# Patient Record
Sex: Female | Born: 1995 | Race: White | Hispanic: No | Marital: Single | Smoking: Former smoker
Health system: Southern US, Community
[De-identification: ages and names within clinical notes are randomized; demographics above are authoritative.]

## PROBLEM LIST (undated history)

## (undated) DIAGNOSIS — D682 Hereditary deficiency of other clotting factors: Secondary | ICD-10-CM

## (undated) DIAGNOSIS — I82409 Acute embolism and thrombosis of unspecified deep veins of unspecified lower extremity: Secondary | ICD-10-CM

## (undated) DIAGNOSIS — E119 Type 2 diabetes mellitus without complications: Secondary | ICD-10-CM

## (undated) HISTORY — PX: APPENDECTOMY: SHX54

---

## 2020-02-07 DIAGNOSIS — E1010 Type 1 diabetes mellitus with ketoacidosis without coma: Secondary | ICD-10-CM | POA: Diagnosis not present

## 2020-02-07 DIAGNOSIS — R4182 Altered mental status, unspecified: Secondary | ICD-10-CM | POA: Diagnosis not present

## 2020-02-07 DIAGNOSIS — T401X1A Poisoning by heroin, accidental (unintentional), initial encounter: Secondary | ICD-10-CM | POA: Diagnosis not present

## 2020-02-07 DIAGNOSIS — E871 Hypo-osmolality and hyponatremia: Secondary | ICD-10-CM | POA: Diagnosis not present

## 2020-02-08 DIAGNOSIS — E871 Hypo-osmolality and hyponatremia: Secondary | ICD-10-CM | POA: Diagnosis not present

## 2020-02-08 DIAGNOSIS — E1010 Type 1 diabetes mellitus with ketoacidosis without coma: Secondary | ICD-10-CM | POA: Diagnosis not present

## 2020-02-08 DIAGNOSIS — T401X1A Poisoning by heroin, accidental (unintentional), initial encounter: Secondary | ICD-10-CM | POA: Diagnosis not present

## 2020-02-08 DIAGNOSIS — R4182 Altered mental status, unspecified: Secondary | ICD-10-CM | POA: Diagnosis not present

## 2020-08-10 ENCOUNTER — Emergency Department (HOSPITAL_COMMUNITY)
Admission: EM | Admit: 2020-08-10 | Discharge: 2020-08-10 | Disposition: A | Discharge status: Home / Self Care | Payer: Medicaid Other | Attending: Emergency Medicine | Admitting: Emergency Medicine

## 2020-08-10 ENCOUNTER — Encounter (HOSPITAL_COMMUNITY): Payer: Self-pay

## 2020-08-10 ENCOUNTER — Other Ambulatory Visit: Payer: Self-pay

## 2020-08-10 DIAGNOSIS — Z794 Long term (current) use of insulin: Secondary | ICD-10-CM | POA: Diagnosis not present

## 2020-08-10 DIAGNOSIS — F17210 Nicotine dependence, cigarettes, uncomplicated: Secondary | ICD-10-CM | POA: Diagnosis not present

## 2020-08-10 DIAGNOSIS — Z7984 Long term (current) use of oral hypoglycemic drugs: Secondary | ICD-10-CM | POA: Diagnosis not present

## 2020-08-10 DIAGNOSIS — R739 Hyperglycemia, unspecified: Secondary | ICD-10-CM | POA: Diagnosis present

## 2020-08-10 DIAGNOSIS — E1165 Type 2 diabetes mellitus with hyperglycemia: Secondary | ICD-10-CM | POA: Insufficient documentation

## 2020-08-10 HISTORY — DX: Type 2 diabetes mellitus without complications: E11.9

## 2020-08-10 LAB — CBC WITH DIFFERENTIAL/PLATELET
Abs Immature Granulocytes: 0.04 10*3/uL (ref 0.00–0.07)
Basophils Absolute: 0.1 10*3/uL (ref 0.0–0.1)
Basophils Relative: 1 %
Eosinophils Absolute: 0.2 10*3/uL (ref 0.0–0.5)
Eosinophils Relative: 2 %
HCT: 37.5 % (ref 36.0–46.0)
Hemoglobin: 12.5 g/dL (ref 12.0–15.0)
Immature Granulocytes: 0 %
Lymphocytes Relative: 20 %
Lymphs Abs: 1.9 10*3/uL (ref 0.7–4.0)
MCH: 32.8 pg (ref 26.0–34.0)
MCHC: 33.3 g/dL (ref 30.0–36.0)
MCV: 98.4 fL (ref 80.0–100.0)
Monocytes Absolute: 0.8 10*3/uL (ref 0.1–1.0)
Monocytes Relative: 9 %
Neutro Abs: 6.3 10*3/uL (ref 1.7–7.7)
Neutrophils Relative %: 68 %
Platelets: 226 10*3/uL (ref 150–400)
RBC: 3.81 MIL/uL — ABNORMAL LOW (ref 3.87–5.11)
RDW: 11.7 % (ref 11.5–15.5)
WBC: 9.2 10*3/uL (ref 4.0–10.5)
nRBC: 0 % (ref 0.0–0.2)

## 2020-08-10 LAB — BASIC METABOLIC PANEL
Anion gap: 14 (ref 5–15)
BUN: 7 mg/dL (ref 6–20)
CO2: 22 mmol/L (ref 22–32)
Calcium: 8.5 mg/dL — ABNORMAL LOW (ref 8.9–10.3)
Chloride: 95 mmol/L — ABNORMAL LOW (ref 98–111)
Creatinine, Ser: 0.79 mg/dL (ref 0.44–1.00)
GFR, Estimated: >60 mL/min (ref >60)
Glucose, Bld: 469 mg/dL — ABNORMAL HIGH (ref 70–99)
Potassium: 4.1 mmol/L (ref 3.5–5.1)
Sodium: 131 mmol/L — ABNORMAL LOW (ref 135–145)

## 2020-08-10 LAB — CBG MONITORING, ED
Glucose-Capillary: 470 mg/dL — ABNORMAL HIGH (ref 70–99)
Glucose-Capillary: 347 mg/dL — ABNORMAL HIGH (ref 70–99)

## 2020-08-10 LAB — I-STAT BETA HCG BLOOD, ED (MC, WL, AP ONLY): I-stat hCG, quantitative: <5 m[IU]/mL (ref <5)

## 2020-08-10 MED ORDER — INSULIN ASPART 100 UNIT/ML ~~LOC~~ SOLN
6.0000 [IU] | Freq: Once | SUBCUTANEOUS | Status: AC
  Administered 2020-08-10: 6 [IU] via INTRAVENOUS
  Filled 2020-08-10: qty 0.06

## 2020-08-10 MED ORDER — SODIUM CHLORIDE 0.9 % IV BOLUS
1000.0000 mL | Freq: Once | INTRAVENOUS | Status: AC
  Administered 2020-08-10: 1000 mL via INTRAVENOUS

## 2020-08-10 NOTE — ED Provider Notes (Signed)
Rebecca Bryant   CSN: [id number] Arrival date & time: 08/10/20  1342     History Chief Complaint  Patient presents with  . Hyperglycemia    Rebecca Bryant is a 24 y.o. female.  24 year old female with prior medical history detailed below presents for evaluation of reported hyperglycemia.  Patient reports poor compliance with previously prescribed diabetes medications.  Patient reports that EMS presented to her house after a minor fall yesterday.  She was advised that her sugars are high.  She is presenting today requesting treatment for those sugars.  She denies nausea or vomiting.  She denies fever.  She is unsure as to the last time she took anything for her sugar at home.  The history is provided by the patient and medical records.  Hyperglycemia Blood sugar level PTA:  500 Severity:  Moderate Onset quality:  Unable to specify Timing:  Unable to specify Progression:  Unable to specify Diabetes status:  Unable to specify      Past Medical History:  Diagnosis Date  . Diabetes mellitus without complication (HCC)     There are no problems to display for this patient.   History reviewed. No pertinent surgical history.   OB History   No obstetric history on file.     History reviewed. No pertinent family history.  Social History   Tobacco Use  . Smoking status: Current Every Day Smoker    Types: Cigarettes  . Smokeless tobacco: Current User  . 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 Alcohol use: Never  . Drug use: Not on file    Home Medications Prior to Admission medications   Medication Sig Start Date End Date Taking? Authorizing Provider  Continuous Blood Gluc Receiver (DEXCOM G6 RECEIVER) DEVI Use as directed for continuous glucose monitoring. 07/18/20  Yes [provider]  insulin aspart (NOVOLOG) 100 UNIT/ML injection Use as directed. Max daily dose is 50  units. 08/03/20  Yes [provider]  Continuous Blood Gluc Receiver (DEXCOM G6 RECEIVER) DEVI USE AS DIRECTED FOR CONTINOUS GLUCOSE MONITORING 08/03/20   [provider]  NOVOLOG 100 UNIT/ML injection Inject into the skin as directed. 08/03/20   [provider]    Allergies    Patient has no known allergies.  Review of Systems   Review of Systems  All other systems reviewed and are negative.   Physical Exam Updated Vital Signs BP (!) 126/93 (BP Location: Left Arm)   Pulse 94   Temp 98.7 F (37.1 C) (Oral)   Resp 16   Ht 5\' 9"  (1.753 m)   Wt 50.3 kg   SpO2 96%   BMI 16.39 kg/m   Physical Exam Vitals and nursing Bryant reviewed.  Constitutional:      General: She is not in acute distress.    Appearance: Normal appearance. She is well-developed.  HENT:     Head: Normocephalic and atraumatic.  Eyes:     Conjunctiva/sclera: Conjunctivae normal.     Pupils: Pupils are equal, round, and reactive to light.  Cardiovascular:     Rate and Rhythm: Normal rate and regular rhythm.     Heart sounds: Normal heart sounds.  Pulmonary:     Effort: Pulmonary effort is normal. No respiratory distress.     Breath sounds: Normal breath sounds.  Abdominal:     General: There is no distension.     Palpations: Abdomen is soft.  Tenderness: There is no abdominal tenderness.  Musculoskeletal:        General: No deformity. Normal range of motion.     Cervical back: Normal range of motion and neck supple.  Skin:    General: Skin is warm and dry.  Neurological:     General: No focal deficit present.     Mental Status: She is alert and oriented to person, place, and time.     ED Results / Procedures / Treatments   Labs (all labs ordered are listed, but only abnormal results are displayed) Labs Reviewed  CBC WITH DIFFERENTIAL/PLATELET - Abnormal; Notable for the following components:      Result Value   RBC 3.81 (*)    All other components within normal  limits  CBG MONITORING, ED - Abnormal; Notable for the following components:   Glucose-Capillary 470 (*)    All other components within normal limits  BASIC METABOLIC PANEL  I-STAT BETA HCG BLOOD, ED ([facility], [facility], [facility] ONLY)    EKG None  Radiology No results found.  Procedures Procedures (including critical care time)  Medications Ordered in ED Medications  sodium chloride 0.9 % bolus 1,000 mL (1,000 mLs Intravenous New Bag/Given 08/10/20 1428)  insulin aspart (novoLOG) injection 6 Units (6 Units Intravenous Given 08/10/20 1508)    ED Course  I have reviewed the triage vital signs and the nursing notes.  Pertinent labs & imaging results that were available during my care of the patient were reviewed by me and considered in my medical decision making (see chart for details).    MDM Rules/Calculators/A&P                         MDM  Screen complete  Rebecca Bryant was evaluated in Emergency Department on 08/10/2020 for the symptoms described in the history of present illness. She was evaluated in the context of the global COVID-19 pandemic, which necessitated consideration that the patient might be at risk for infection with the SARS-CoV-2 virus that causes COVID-19. Institutional protocols and algorithms that pertain to the evaluation of patients at risk for COVID-19 are in a state of rapid change based on information released by regulatory bodies including the CDC and federal and state organizations. These policies and algorithms were followed during the patient's care in the ED.   Patient is presenting for evaluation of reported hyperglycemia.  Patient without evidence of DKA or other metabolic abnormality on work-up.  Patient does appear to be appropriate for outpatient management.  Importance of close follow-up is stressed.   Final Clinical Impression(s) / ED Diagnoses Final diagnoses:  Hyperglycemia    Rx / DC Orders ED Discharge Orders    None       Wynetta Fines,  MD 08/10/20 1539

## 2020-08-10 NOTE — Discharge Instructions (Addendum)
Please return for any problem.   Take your medications as prescribed.

## 2020-08-10 NOTE — ED Triage Notes (Signed)
Pt BIB EMS. Pt c/o fluctuating glucose readings for the past week. Pt does take insulin; pt states she does not always take it like she should. Pt complaining of feeling dehydrated. EMS gave 400 fluids.   CBG-499 BP-130/80 HR-104

## 2020-09-11 ENCOUNTER — Other Ambulatory Visit: Payer: Self-pay

## 2020-09-11 DIAGNOSIS — F17210 Nicotine dependence, cigarettes, uncomplicated: Secondary | ICD-10-CM | POA: Diagnosis present

## 2020-09-11 DIAGNOSIS — E860 Dehydration: Secondary | ICD-10-CM | POA: Diagnosis present

## 2020-09-11 DIAGNOSIS — E1065 Type 1 diabetes mellitus with hyperglycemia: Secondary | ICD-10-CM | POA: Diagnosis present

## 2020-09-11 DIAGNOSIS — Z794 Long term (current) use of insulin: Secondary | ICD-10-CM

## 2020-09-11 DIAGNOSIS — T383X6A Underdosing of insulin and oral hypoglycemic [antidiabetic] drugs, initial encounter: Secondary | ICD-10-CM | POA: Diagnosis present

## 2020-09-11 DIAGNOSIS — E1043 Type 1 diabetes mellitus with diabetic autonomic (poly)neuropathy: Principal | ICD-10-CM | POA: Diagnosis present

## 2020-09-11 DIAGNOSIS — Z9114 Patient's other noncompliance with medication regimen: Secondary | ICD-10-CM

## 2020-09-11 DIAGNOSIS — K5900 Constipation, unspecified: Secondary | ICD-10-CM | POA: Diagnosis present

## 2020-09-11 DIAGNOSIS — Z91128 Patient's intentional underdosing of medication regimen for other reason: Secondary | ICD-10-CM

## 2020-09-11 DIAGNOSIS — B1920 Unspecified viral hepatitis C without hepatic coma: Secondary | ICD-10-CM | POA: Diagnosis present

## 2020-09-11 DIAGNOSIS — K3184 Gastroparesis: Secondary | ICD-10-CM | POA: Diagnosis present

## 2020-09-11 DIAGNOSIS — Z20822 Contact with and (suspected) exposure to covid-19: Secondary | ICD-10-CM | POA: Diagnosis present

## 2020-09-11 LAB — COMPREHENSIVE METABOLIC PANEL
ALT: 97 U/L — ABNORMAL HIGH (ref 0–44)
AST: 71 U/L — ABNORMAL HIGH (ref 15–41)
Albumin: 3.8 g/dL (ref 3.5–5.0)
Alkaline Phosphatase: 59 U/L (ref 38–126)
Anion gap: 13 (ref 5–15)
BUN: 10 mg/dL (ref 6–20)
CO2: 25 mmol/L (ref 22–32)
Calcium: 9.2 mg/dL (ref 8.9–10.3)
Chloride: 100 mmol/L (ref 98–111)
Creatinine, Ser: 0.82 mg/dL (ref 0.44–1.00)
GFR, Estimated: >60 mL/min (ref >60)
Glucose, Bld: 175 mg/dL — ABNORMAL HIGH (ref 70–99)
Potassium: 3.7 mmol/L (ref 3.5–5.1)
Sodium: 138 mmol/L (ref 135–145)
Total Bilirubin: 0.6 mg/dL (ref 0.3–1.2)
Total Protein: 7.4 g/dL (ref 6.5–8.1)

## 2020-09-11 LAB — URINALYSIS, COMPLETE (UACMP) WITH MICROSCOPIC
Bacteria, UA: NONE SEEN
Bilirubin Urine: NEGATIVE
Glucose, UA: >=500 mg/dL — AB
Hgb urine dipstick: NEGATIVE
Ketones, ur: 5 mg/dL — AB
Leukocytes,Ua: NEGATIVE
Nitrite: NEGATIVE
Protein, ur: NEGATIVE mg/dL
Specific Gravity, Urine: 1.033 — ABNORMAL HIGH (ref 1.005–1.030)
pH: 5 (ref 5.0–8.0)

## 2020-09-11 LAB — CBC
HCT: 39.4 % (ref 36.0–46.0)
Hemoglobin: 13.1 g/dL (ref 12.0–15.0)
MCH: 32.3 pg (ref 26.0–34.0)
MCHC: 33.2 g/dL (ref 30.0–36.0)
MCV: 97 fL (ref 80.0–100.0)
Platelets: 291 10*3/uL (ref 150–400)
RBC: 4.06 MIL/uL (ref 3.87–5.11)
RDW: 12.4 % (ref 11.5–15.5)
WBC: 8 10*3/uL (ref 4.0–10.5)
nRBC: 0 % (ref 0.0–0.2)

## 2020-09-11 LAB — POC URINE PREG, ED: Preg Test, Ur: NEGATIVE

## 2020-09-11 LAB — LIPASE, BLOOD: Lipase: 27 U/L (ref 11–51)

## 2020-09-11 NOTE — ED Triage Notes (Signed)
Pt here with abd pain, N/V, back pain, HA, and constipation. Pt is a type 1 diabetic. Pt reports that he cbg readings at home were wnl. Pt NAD in triage.

## 2020-09-12 ENCOUNTER — Inpatient Hospital Stay
Admission: EM | Admit: 2020-09-12 | Discharge: 2020-09-14 | DRG: 074 | Disposition: A | Discharge status: Home / Self Care | Payer: Medicaid Other | Attending: Internal Medicine | Admitting: Internal Medicine

## 2020-09-12 ENCOUNTER — Emergency Department: Payer: Medicaid Other

## 2020-09-12 DIAGNOSIS — E119 Type 2 diabetes mellitus without complications: Secondary | ICD-10-CM

## 2020-09-12 DIAGNOSIS — R109 Unspecified abdominal pain: Secondary | ICD-10-CM | POA: Diagnosis not present

## 2020-09-12 DIAGNOSIS — R1084 Generalized abdominal pain: Secondary | ICD-10-CM

## 2020-09-12 DIAGNOSIS — R112 Nausea with vomiting, unspecified: Secondary | ICD-10-CM | POA: Diagnosis not present

## 2020-09-12 DIAGNOSIS — R7989 Other specified abnormal findings of blood chemistry: Secondary | ICD-10-CM

## 2020-09-12 DIAGNOSIS — E1065 Type 1 diabetes mellitus with hyperglycemia: Secondary | ICD-10-CM

## 2020-09-12 DIAGNOSIS — Z720 Tobacco use: Secondary | ICD-10-CM | POA: Diagnosis present

## 2020-09-12 LAB — GLUCOSE, CAPILLARY: Glucose-Capillary: 226 mg/dL — ABNORMAL HIGH (ref 70–99)

## 2020-09-12 LAB — CBG MONITORING, ED
Glucose-Capillary: 412 mg/dL — ABNORMAL HIGH (ref 70–99)
Glucose-Capillary: 405 mg/dL — ABNORMAL HIGH (ref 70–99)
Glucose-Capillary: 372 mg/dL — ABNORMAL HIGH (ref 70–99)
Glucose-Capillary: 113 mg/dL — ABNORMAL HIGH (ref 70–99)
Glucose-Capillary: 300 mg/dL — ABNORMAL HIGH (ref 70–99)

## 2020-09-12 LAB — RESP PANEL BY RT-PCR (FLU A&B, COVID) ARPGX2
Influenza A by PCR: NEGATIVE
Influenza B by PCR: NEGATIVE
SARS Coronavirus 2 by RT PCR: NEGATIVE

## 2020-09-12 LAB — BASIC METABOLIC PANEL
Anion gap: 10 (ref 5–15)
BUN: 11 mg/dL (ref 6–20)
CO2: 24 mmol/L (ref 22–32)
Calcium: 9.1 mg/dL (ref 8.9–10.3)
Chloride: 98 mmol/L (ref 98–111)
Creatinine, Ser: 0.55 mg/dL (ref 0.44–1.00)
GFR, Estimated: >60 mL/min (ref >60)
Glucose, Bld: 421 mg/dL — ABNORMAL HIGH (ref 70–99)
Potassium: 4.8 mmol/L (ref 3.5–5.1)
Sodium: 132 mmol/L — ABNORMAL LOW (ref 135–145)

## 2020-09-12 LAB — LIPASE, BLOOD: Lipase: 27 U/L (ref 11–51)

## 2020-09-12 LAB — HEMOGLOBIN A1C
Hgb A1c MFr Bld: 11.6 % — ABNORMAL HIGH (ref 4.8–5.6)
Mean Plasma Glucose: 286.22 mg/dL

## 2020-09-12 LAB — HCG, QUANTITATIVE, PREGNANCY: hCG, Beta Chain, Quant, S: <1 m[IU]/mL (ref <5)

## 2020-09-12 MED ORDER — LACTATED RINGERS IV BOLUS
1000.0000 mL | Freq: Once | INTRAVENOUS | Status: AC
  Administered 2020-09-12: 1000 mL via INTRAVENOUS

## 2020-09-12 MED ORDER — IOHEXOL 300 MG/ML  SOLN
75.0000 mL | Freq: Once | INTRAMUSCULAR | Status: AC | PRN
  Administered 2020-09-12: 75 mL via INTRAVENOUS

## 2020-09-12 MED ORDER — INSULIN GLARGINE 100 UNIT/ML ~~LOC~~ SOLN
25.0000 [IU] | Freq: Every day | SUBCUTANEOUS | Status: DC
  Filled 2020-09-12: qty 0.25

## 2020-09-12 MED ORDER — INSULIN ASPART 100 UNIT/ML ~~LOC~~ SOLN
3.0000 [IU] | Freq: Three times a day (TID) | SUBCUTANEOUS | Status: DC
  Administered 2020-09-12 – 2020-09-14 (×5): 3 [IU] via SUBCUTANEOUS
  Filled 2020-09-12 (×6): qty 1

## 2020-09-12 MED ORDER — ENOXAPARIN SODIUM 40 MG/0.4ML ~~LOC~~ SOLN
40.0000 mg | SUBCUTANEOUS | Status: DC
  Administered 2020-09-12 – 2020-09-13 (×2): 40 mg via SUBCUTANEOUS
  Filled 2020-09-12 (×2): qty 0.4

## 2020-09-12 MED ORDER — NICOTINE 21 MG/24HR TD PT24
21.0000 mg | MEDICATED_PATCH | Freq: Every day | TRANSDERMAL | Status: DC
  Administered 2020-09-12 – 2020-09-14 (×3): 21 mg via TRANSDERMAL
  Filled 2020-09-12 (×3): qty 1

## 2020-09-12 MED ORDER — INSULIN ASPART 100 UNIT/ML ~~LOC~~ SOLN
0.0000 [IU] | Freq: Every day | SUBCUTANEOUS | Status: DC
  Administered 2020-09-12: 22:00:00 2 [IU] via SUBCUTANEOUS
  Filled 2020-09-12: qty 1

## 2020-09-12 MED ORDER — INSULIN ASPART 100 UNIT/ML ~~LOC~~ SOLN
10.0000 [IU] | Freq: Once | SUBCUTANEOUS | Status: AC
  Administered 2020-09-12: 10 [IU] via INTRAVENOUS
  Filled 2020-09-12: qty 1

## 2020-09-12 MED ORDER — FENTANYL CITRATE (PF) 100 MCG/2ML IJ SOLN
50.0000 ug | Freq: Once | INTRAMUSCULAR | Status: AC
  Administered 2020-09-12: 50 ug via INTRAVENOUS
  Filled 2020-09-12: qty 2

## 2020-09-12 MED ORDER — LACTATED RINGERS IV SOLN: INTRAVENOUS | Status: DC

## 2020-09-12 MED ORDER — INSULIN ASPART 100 UNIT/ML ~~LOC~~ SOLN
0.0000 [IU] | Freq: Three times a day (TID) | SUBCUTANEOUS | Status: DC
  Administered 2020-09-12: 5 [IU] via SUBCUTANEOUS
  Administered 2020-09-12: 9 [IU] via SUBCUTANEOUS
  Administered 2020-09-13: 5 [IU] via SUBCUTANEOUS
  Administered 2020-09-13: 2 [IU] via SUBCUTANEOUS
  Administered 2020-09-13: 9 [IU] via SUBCUTANEOUS
  Administered 2020-09-14: 5 [IU] via SUBCUTANEOUS
  Filled 2020-09-12 (×6): qty 1

## 2020-09-12 MED ORDER — METOCLOPRAMIDE HCL 5 MG/ML IJ SOLN
5.0000 mg | Freq: Three times a day (TID) | INTRAMUSCULAR | Status: DC
  Administered 2020-09-12 – 2020-09-14 (×7): 5 mg via INTRAVENOUS
  Filled 2020-09-12 (×7): qty 2

## 2020-09-12 MED ORDER — MORPHINE SULFATE (PF) 4 MG/ML IV SOLN
4.0000 mg | Freq: Once | INTRAVENOUS | Status: AC
  Administered 2020-09-12: 4 mg via INTRAVENOUS
  Filled 2020-09-12: qty 1

## 2020-09-12 MED ORDER — POLYETHYLENE GLYCOL 3350 17 G PO PACK: 17.0000 g | PACK | Freq: Every day | ORAL | Status: DC | PRN

## 2020-09-12 MED ORDER — ONDANSETRON HCL 4 MG/2ML IJ SOLN: 4.0000 mg | Freq: Three times a day (TID) | INTRAMUSCULAR | Status: DC | PRN

## 2020-09-12 MED ORDER — MORPHINE SULFATE (PF) 2 MG/ML IV SOLN
2.0000 mg | INTRAVENOUS | Status: DC | PRN
  Administered 2020-09-12 – 2020-09-14 (×8): 2 mg via INTRAVENOUS
  Filled 2020-09-12 (×9): qty 1

## 2020-09-12 MED ORDER — INSULIN GLARGINE 100 UNIT/ML ~~LOC~~ SOLN
25.0000 [IU] | Freq: Every day | SUBCUTANEOUS | Status: DC
  Administered 2020-09-12: 25 [IU] via SUBCUTANEOUS
  Filled 2020-09-12 (×2): qty 0.25

## 2020-09-12 MED ORDER — ACETAMINOPHEN 325 MG PO TABS
650.0000 mg | ORAL_TABLET | Freq: Four times a day (QID) | ORAL | Status: DC | PRN
  Administered 2020-09-14: 12:00:00 650 mg via ORAL
  Filled 2020-09-12: qty 2

## 2020-09-12 MED ORDER — ONDANSETRON HCL 4 MG/2ML IJ SOLN
4.0000 mg | Freq: Once | INTRAMUSCULAR | Status: AC
  Administered 2020-09-12: 4 mg via INTRAVENOUS
  Filled 2020-09-12: qty 2

## 2020-09-12 MED ORDER — HALOPERIDOL LACTATE 5 MG/ML IJ SOLN
5.0000 mg | Freq: Once | INTRAMUSCULAR | Status: AC
  Administered 2020-09-12: 5 mg via INTRAVENOUS
  Filled 2020-09-12: qty 1

## 2020-09-12 NOTE — ED Notes (Signed)
Pt eating lunch

## 2020-09-12 NOTE — ED Notes (Signed)
Pt given ice chips

## 2020-09-12 NOTE — ED Notes (Signed)
Pt alert  Iv fluids infusing.  meds given for abd pain.

## 2020-09-12 NOTE — ED Notes (Signed)
fsbs 300

## 2020-09-12 NOTE — ED Notes (Signed)
Pt requesting pain medication- informed pt that it was not time and that she had one more hour- pt up to use bathroom

## 2020-09-12 NOTE — ED Notes (Signed)
Pt to CT

## 2020-09-12 NOTE — Progress Notes (Addendum)
Inpatient Diabetes Program Recommendations  AACE/ADA: New Consensus Statement on Inpatient Glycemic Control (2015)  Target Ranges:  Prepandial:   less than 140 mg/dL      Peak postprandial:   less than 180 mg/dL (1-2 hours)      Critically ill patients:  140 - 180 mg/dL   Lab Results  Component Value Date   GLUCAP 113 (H) 09/12/2020   HGBA1C 11.6 (H) 09/12/2020    Review of Glycemic Control Results for Rebecca Bryant, Rebecca Bryant (MRN [id number]) as of 09/12/2020 12:42  Ref. Range 09/12/2020 04:41 09/12/2020 06:00 09/12/2020 08:36 09/12/2020 11:28  Glucose-Capillary Latest Ref Range: 70 - 99 mg/dL 937 (H) 342 (H) 876 (H) 113 (H)   Diabetes history: DM 1 Outpatient Diabetes medications:  Lantus 30 units q HS, Novolog with meals (per chart review from [facility]: Patient will continue with NovoLog according to carb ratio of 10 however I have added correction factor of 100 with target blood sugar of 100. Advised the patient to take NovoLog 3 times daily)  Current orders for Inpatient glycemic control:  Novolog sensitive tid with meals and HS Lantus 25 units daily  Inpatient Diabetes Program Recommendations:    Note that A1C is improved from October of 2021 (it was >15%).  Blood sugars likely increased this morning due to not receiving basal insulin last night since patient has Type 1 DM. Agree with current orders.  If patient is to be admitted she will also need the addition of Novolog meal coverage 3 units tid with meals.  Will follow.   Thanks,  Beryl Meager, RN, BC-ADM Inpatient Diabetes Coordinator Pager [phone] (8a-5p)

## 2020-09-12 NOTE — ED Notes (Signed)
Report messaged to doreen rn floor nurse  

## 2020-09-12 NOTE — ED Notes (Addendum)
Pt up ad lib to restroom

## 2020-09-12 NOTE — Plan of Care (Signed)

## 2020-09-12 NOTE — H&P (Signed)
History and Physical    Rebecca CaoHannah Bryant AOZ:[id number] DOB: 1995/10/26 DOA: 09/12/2020  Referring MD/NP/PA:   PCP: Felipa Furnacelapp, Amber D, NP   Patient coming from:  The patient is coming from home.  At baseline, pt is independent for most of ADL.        Chief Complaint: Nausea, vomiting, abdominal pain  HPI: Rebecca Bryant is a 24 y.o. female with medical history significant of type 1 diabetes, tobacco abuse, who presents with nausea, vomiting, abdominal pain.  Patient states that she has been having nausea, vomiting, abdominal pain for almost 3 days. She has at least 3 times of nonbilious nonbloody vomiting each day.  Abdominal pain is located in the lower abdomen, constant, sharp, cramping, 8 out of 10 severity, nonradiating.  Patient denies diarrhea.  No fever or chills.  Patient does not have chest pain, shortness breath, cough.  No symptoms of UTI.  No hematuria.  ED Course: pt was found to have WBC 8.0, lipase 27, negative pregnancy test, negative Covid PCR, renal function okay, temperature normal, blood pressure 130/88, heart rate 119, 85, RR 18, oxygen saturation 98% on room air.  CT abdomen/pelvis is negative for acute intra-abdominal issues.  Patient is placed on MedSurg bed for observation.  Review of Systems:   General: no fevers, chills, no body weight gain, has poor appetite, has fatigue HEENT: no blurry vision, hearing changes or sore throat Respiratory: no dyspnea, coughing, wheezing CV: no chest pain, no palpitations GI: has nausea, vomiting, abdominal pain, no diarrhea, has constipation GU: no dysuria, burning on urination, increased urinary frequency, hematuria  Ext: no leg edema Neuro: no unilateral weakness, numbness, or tingling, no vision change or hearing loss Skin: no rash, no skin tear. MSK: No muscle spasm, no deformity, no limitation of range of movement in spin Heme: No easy bruising.  Travel history: No recent long distant travel.  Allergy: No Known  Allergies  Past Medical History:  Diagnosis Date  . Diabetes mellitus without complication [facility](HCC)     Past Surgical History:  Procedure Laterality Date  . APPENDECTOMY      Social History:  reports that she has been smoking cigarettes. She uses smokeless tobacco. She reports that she does not drink alcohol and does not use drugs.  Family History:  Family History  Problem Relation Age of Onset  . Diabetes Mellitus II Brother      Prior to Admission medications   Medication Sig Start Date End Date Taking? Authorizing Provider  Continuous Blood Gluc Receiver (DEXCOM G6 RECEIVER) DEVI Use as directed for continuous glucose monitoring. 07/18/20   [provider]  Continuous Blood Gluc Receiver (DEXCOM G6 RECEIVER) DEVI USE AS DIRECTED FOR CONTINOUS GLUCOSE MONITORING 08/03/20   [provider]  insulin aspart (NOVOLOG) 100 UNIT/ML injection Use as directed. Max daily dose is 50 units. 08/03/20   [provider]  LANTUS SOLOSTAR 100 UNIT/ML Solostar Pen SMARTSIG:30 Unit(s) SUB-Q Every Night 06/23/20   [provider]  NOVOLOG 100 UNIT/ML injection Inject into the skin as directed. 08/03/20   [provider]    Physical Exam: Vitals:   09/12/20 0307 09/12/20 0451 09/12/20 0700 09/12/20 0856  BP: 128/86 130/88 131/72 128/84  Pulse: 83 85 84 80  Resp: 15 15 16 18   Temp:      TempSrc:      SpO2: 98% 98% 97% 97%  Weight:      Height:       General: Not in acute distress.  Dry  mucosal membrane HEENT:       Eyes: PERRL, EOMI, no scleral icterus.       ENT: No discharge from the ears and nose, no pharynx injection, no tonsillar enlargement.        Neck: No JVD, no bruit, no mass felt. Heme: No neck lymph node enlargement. Cardiac: S1/S2, RRR, No murmurs, No gallops or rubs. Respiratory: No rales, wheezing, rhonchi or rubs. GI: Soft, nondistended, has tenderness in the lower abdomen, no rebound pain, no organomegaly, BS present. GU: No  hematuria Ext: No pitting leg edema bilaterally. 1+DP/PT pulse bilaterally. Musculoskeletal: No joint deformities, No joint redness or warmth, no limitation of ROM in spin. Skin: No rashes.  Neuro: Alert, oriented X3, cranial nerves II-XII grossly intact, moves all extremities normally.  Psych: Patient is not psychotic, no suicidal or hemocidal ideation.  Labs on Admission: I have personally reviewed following labs and imaging studies  CBC: Recent Labs  Lab 09/11/20 1819  WBC 8.0  HGB 13.1  HCT 39.4  MCV 97.0  PLT 291   Basic Metabolic Panel: Recent Labs  Lab 09/11/20 1819 09/12/20 0555  NA 138 132*  K 3.7 4.8  CL 100 98  CO2 25 24  GLUCOSE 175* 421*  BUN 10 11  CREATININE 0.82 0.55  CALCIUM 9.2 9.1   GFR: Estimated Creatinine Clearance: 96.2 mL/min (by C-G formula based on SCr of 0.55 mg/dL). Liver Function Tests: Recent Labs  Lab 09/11/20 1819  AST 71*  ALT 97*  ALKPHOS 59  BILITOT 0.6  PROT 7.4  ALBUMIN 3.8   Recent Labs  Lab 09/11/20 1819 09/12/20 0718  LIPASE 27 27   No results for input(s): AMMONIA in the last 168 hours. Coagulation Profile: No results for input(s): INR, PROTIME in the last 168 hours. Cardiac Enzymes: No results for input(s): CKTOTAL, CKMB, CKMBINDEX, TROPONINI in the last 168 hours. BNP (last 3 results) No results for input(s): PROBNP in the last 8760 hours. HbA1C: Recent Labs    09/12/20 0718  HGBA1C 11.6*   CBG: Recent Labs  Lab 09/12/20 0441 09/12/20 0600 09/12/20 0836 09/12/20 1128  GLUCAP 412* 405* 372* 113*   Lipid Profile: No results for input(s): CHOL, HDL, LDLCALC, TRIG, CHOLHDL, LDLDIRECT in the last 72 hours. Thyroid Function Tests: No results for input(s): TSH, T4TOTAL, FREET4, T3FREE, THYROIDAB in the last 72 hours. Anemia Panel: No results for input(s): VITAMINB12, FOLATE, FERRITIN, TIBC, IRON, RETICCTPCT in the last 72 hours. Urine analysis:    Component Value Date/Time   COLORURINE YELLOW (A)  09/11/2020 1819   APPEARANCEUR CLEAR (A) 09/11/2020 1819   LABSPEC 1.033 (H) 09/11/2020 1819   PHURINE 5.0 09/11/2020 1819   GLUCOSEU >=500 (A) 09/11/2020 1819   HGBUR NEGATIVE 09/11/2020 1819   BILIRUBINUR NEGATIVE 09/11/2020 1819   KETONESUR 5 (A) 09/11/2020 1819   PROTEINUR NEGATIVE 09/11/2020 1819   NITRITE NEGATIVE 09/11/2020 1819   LEUKOCYTESUR NEGATIVE 09/11/2020 1819   Sepsis Labs: @LABRCNTIP (procalcitonin:4,lacticidven:4) ) Recent Results (from the past 240 hour(s))  Resp Panel by RT-PCR (Flu A&B, Covid) Nasopharyngeal Swab     Status: None   Collection Time: 09/12/20  2:32 AM   Specimen: Nasopharyngeal Swab; Nasopharyngeal(NP) swabs in vial transport medium  Result Value Ref Range Status   SARS Coronavirus 2 by RT PCR NEGATIVE NEGATIVE Final    Comment: (NOTE) SARS-CoV-2 target nucleic acids are NOT DETECTED.  The SARS-CoV-2 RNA is generally detectable in upper respiratory specimens during the acute phase of infection. The lowest concentration of  SARS-CoV-2 viral copies this assay can detect is 138 copies/mL. A negative result does not preclude SARS-Cov-2 infection and should not be used as the sole basis for treatment or other patient management decisions. A negative result may occur with  improper specimen collection/handling, submission of specimen other than nasopharyngeal swab, presence of viral mutation(s) within the areas targeted by this assay, and inadequate number of viral copies(<138 copies/mL). A negative result must be combined with clinical observations, patient history, and epidemiological information. The expected result is Negative.  Fact Sheet for Patients:  BloggerCourse.com  Fact Sheet for Healthcare Providers:  SeriousBroker.it  This test is no t yet approved or cleared by the [place] FDA and  has been authorized for detection and/or diagnosis of SARS-CoV-2 by FDA under an Emergency Use  Authorization (EUA). This EUA will remain  in effect (meaning this test can be used) for the duration of the COVID-19 declaration under Section 564(b)(1) of the Act, 21 U.S.C.section 360bbb-3(b)(1), unless the authorization is terminated  or revoked sooner.       Influenza A by PCR NEGATIVE NEGATIVE Final   Influenza B by PCR NEGATIVE NEGATIVE Final    Comment: (NOTE) The Xpert Xpress SARS-CoV-2/FLU/RSV plus assay is intended as an aid in the diagnosis of influenza from Nasopharyngeal swab specimens and should not be used as a sole basis for treatment. Nasal washings and aspirates are unacceptable for Xpert Xpress SARS-CoV-2/FLU/RSV testing.  Fact Sheet for Patients: BloggerCourse.com  Fact Sheet for Healthcare Providers: SeriousBroker.it  This test is not yet approved or cleared by the [place] FDA and has been authorized for detection and/or diagnosis of SARS-CoV-2 by FDA under an Emergency Use Authorization (EUA). This EUA will remain in effect (meaning this test can be used) for the duration of the COVID-19 declaration under Section 564(b)(1) of the Act, 21 U.S.C. section 360bbb-3(b)(1), unless the authorization is terminated or revoked.  Performed at [facility], [address], [place], [place] [address]      Radiological Exams on Admission: CT ABDOMEN PELVIS W CONTRAST  Result Date: 09/12/2020 CLINICAL DATA:  Abdominal pain, unspecified. Alternating diarrhea and constipation EXAM: CT ABDOMEN AND PELVIS WITH CONTRAST TECHNIQUE: Multidetector CT imaging of the abdomen and pelvis was performed using the standard protocol following bolus administration of intravenous contrast. CONTRAST:  38mL OMNIPAQUE IOHEXOL 300 MG/ML  SOLN COMPARISON:  10/24/2015 FINDINGS: Lower chest:  No contributory findings. Hepatobiliary: No focal liver abnormality.No evidence of biliary obstruction or stone. Pancreas: Unremarkable.  Spleen: Unremarkable. Adrenals/Urinary Tract: Negative adrenals. No hydronephrosis or stone. Unremarkable bladder. Stomach/Bowel: No obstruction. Appendectomy. No visible bowel inflammation. Moderate distension of the colon by desiccated stool. Vascular/Lymphatic: No acute vascular abnormality. Heterogeneous density of the common femoral and external iliac veins is attributed to mixing artifact. No mass or adenopathy. Reproductive:Prominent size parametrial veins with distended left ovarian vein measuring up to 8 mm in diameter, incidental to the current presentation. Other: No ascites or pneumoperitoneum. Musculoskeletal: No acute abnormalities. IMPRESSION: No acute finding.  No visible bowel inflammation or obstruction. Electronically Signed   By: Marnee Spring M.D.   On: 09/12/2020 04:58     EKG: I have personally reviewed.  Sinus rhythm, QTC 441, low voltage, tachycardia, LAD sinus rhythm, QTC 441, low voltage, tachycardia, LAE.  Assessment/Plan Principal Problem:   Intractable nausea and vomiting Active Problems:   Diabetes mellitus without complication (HCC)   Abdominal pain   Tobacco abuse   Intractable nausea and vomiting and abdominal pain: Etiology is not  clear.  Potential differential diagnosis includes gastroparesis given poorly controlled type 1 diabetes and viral gastritis.  Patient does not have diarrhea.  CT abdomen/pelvis is negative.  Lipase negative.  Patient is dehydrated.  -Placed on MedSurg [place] for observation -IV fluid: 3 L LR, then 100 cc/h -Schedule Reglan 5 mg 3 times daily, as needed Zofran -as needed morphine  Diabetes mellitus without complication (HCC): A1c 11.6 today.  Patient is taking NovoLog and Lantus.  Blood sugar 421, anion gap 10. -Decrease Lantus dose from 30 to 25 unit daily,  -sliding scale insulin -3 units of NovoLog 3 times daily with meals  Tobacco abuse -Nicotine patch     DVT ppx: SQ Lovenox Code Status: Full code Family  Communication: not done, no family member is at bed side.  Disposition Plan:  Anticipate discharge back to previous environment Consults called:  none Admission status: Med-surg bed for obs   Status is: Observation  The patient remains OBS appropriate and will d/c before 2 midnights.  Dispo: The patient is from: Home              Anticipated d/c is to: Home              Anticipated d/c date is: 1 day              Patient currently is not medically stable to d/c.          Date of Service 09/12/2020    Lorretta Harp [facility]   If 7PM-7AM, please contact night-coverage [email or web address] 09/12/2020, 3:57 PM

## 2020-09-12 NOTE — ED Notes (Signed)
Called lab they advised A1C and BMP was already received

## 2020-09-12 NOTE — ED Notes (Signed)
Pt resting quietly.  meds given .  Iv fluids infusing.

## 2020-09-12 NOTE — ED Notes (Signed)
Assumed care of pt upon being roomed, reports diarrhea 4 days ago then constipation with hard and small BM. AO x4. Talking in full sentences. Complaining of back pain, bilateral rib pain, and bilateral back pain.

## 2020-09-12 NOTE — ED Provider Notes (Signed)
[facility] Emergency Department Provider Note  ____________________________________________  Time seen: Approximately 2:29 AM  I have reviewed the triage vital signs and the nursing notes.   HISTORY  Chief Complaint Abdominal Pain   HPI Rebecca Bryant is a 24 y.o. female history of type 1 diabetes who presents for evaluation of abdominal pain. Patient reports diffuse cramping constant abdominal pain for 4 days associated with nausea and today patient started having vomiting. She had 2-3 episodes of nonbloody nonbilious emesis. She reports being constipated for 3 days but had a bowel movement today. She has had an appendectomy in the past but no other abdominal surgeries. No vaginal discharge, no dysuria, no hematuria, no fever but has had chills.  Patient reports that her CBGs readings at home have been slightly elevated. She reports that she used to have similar symptoms in the past when her glucose was not well controlled but since she was able to get her sugars control over the last few years she has not really had much of this pain. She denies any known history of gastroparesis. She denies any known exposures to Covid. Patient denies vaginal discharge or any concerns for STD.  Past Medical History:  Diagnosis Date  . Diabetes mellitus without complication ([place])      Prior to Admission medications   Medication Sig Start Date End Date Taking? Authorizing Provider  Continuous Blood Gluc Receiver (DEXCOM G6 RECEIVER) DEVI Use as directed for continuous glucose monitoring. 07/18/20   [provider]  Continuous Blood Gluc Receiver (DEXCOM G6 RECEIVER) DEVI USE AS DIRECTED FOR CONTINOUS GLUCOSE MONITORING 08/03/20   [provider]  insulin aspart (NOVOLOG) 100 UNIT/ML injection Use as directed. Max daily dose is 50 units. 08/03/20   [provider]  LANTUS SOLOSTAR 100 UNIT/ML Solostar Pen SMARTSIG:30 Unit(s) SUB-Q Every Night 06/23/20    [provider]  NOVOLOG 100 UNIT/ML injection Inject into the skin as directed. 08/03/20   [provider]    Allergies Patient has no known allergies.  No family history on file.  Social History Social History   Tobacco Use  . Smoking status: Current Every Day Smoker    Types: Cigarettes  . Smokeless tobacco: Current User  . 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 Alcohol use: Never  . Drug use: Not on file    Review of Systems  Constitutional: Negative for fever. + chills Eyes: Negative for visual changes. ENT: Negative for sore throat. Neck: No neck pain  Cardiovascular: Negative for chest pain. Respiratory: Negative for shortness of breath. Gastrointestinal: + abdominal pain, vomiting, and constipation. No diarrhea. Genitourinary: Negative for dysuria. Musculoskeletal: Negative for back pain. Skin: Negative for rash. Neurological: Negative for headaches, weakness or numbness. Psych: No SI or HI  ____________________________________________   PHYSICAL EXAM:  VITAL SIGNS: ED Triage Vitals  Enc Vitals Group     BP 09/11/20 1811 100/64     Pulse Rate 09/11/20 1811 (!) 119     Resp 09/11/20 1811 18     Temp 09/11/20 1811 98.7 F (37.1 C)     Temp Source 09/11/20 1811 Oral     SpO2 09/11/20 1811 97 %     Weight 09/11/20 1812 125 lb (56.7 kg)     Height 09/11/20 1812 '5\' 9"'  (1.753 m)     Head Circumference --      Peak Flow --      Pain Score 09/11/20  1811 9     Pain Loc --      Pain Edu? --      Excl. in [place]? --     Constitutional: Alert and oriented, chronically ill appearing, dry, not under distress.  HEENT:      Head: Normocephalic and atraumatic.         Eyes: Conjunctivae are normal. Sclera is non-icteric.       Mouth/Throat: Mucous membranes are dry.       Neck: Supple with no signs of meningismus. Cardiovascular: Tachycardic with regular rhythm Respiratory: Normal respiratory effort.  Lungs are clear to auscultation bilaterally.  Gastrointestinal: Soft, nondistended with diffuse tenderness, no rebound or guard  Musculoskeletal:  No edema, cyanosis, or erythema of extremities. Neurologic: Normal speech and language. Face is symmetric. Moving all extremities. No gross focal neurologic deficits are appreciated. Skin: Skin is warm, dry and intact. No rash noted. Psychiatric: Mood and affect are normal. Speech and behavior are normal.  ____________________________________________   LABS (all labs ordered are listed, but only abnormal results are displayed)  Labs Reviewed  COMPREHENSIVE METABOLIC PANEL - Abnormal; Notable for the following components:      Result Value   Glucose, Bld 175 (*)    AST 71 (*)    ALT 97 (*)    All other components within normal limits  URINALYSIS, COMPLETE (UACMP) WITH MICROSCOPIC - Abnormal; Notable for the following components:   Color, Urine YELLOW (*)    APPearance CLEAR (*)    Specific Gravity, Urine 1.033 (*)    Glucose, UA >=500 (*)    Ketones, ur 5 (*)    All other components within normal limits  BASIC METABOLIC PANEL - Abnormal; Notable for the following components:   Sodium 132 (*)    Glucose, Bld 421 (*)    All other components within normal limits  CBG MONITORING, ED - Abnormal; Notable for the following components:   Glucose-Capillary 412 (*)    All other components within normal limits  CBG MONITORING, ED - Abnormal; Notable for the following components:   Glucose-Capillary 405 (*)    All other components within normal limits  RESP PANEL BY RT-PCR (FLU A&B, COVID) ARPGX2  LIPASE, BLOOD  CBC  HCG, QUANTITATIVE, PREGNANCY  POC URINE PREG, ED   ____________________________________________  EKG  ED ECG REPORT I, Rudene Re, the attending physician, personally viewed and interpreted this ECG.  Sinus tachycardia, rate of 110, normal intervals, normal axis, no ST elevations or  depressions. ____________________________________________  RADIOLOGY  I have personally reviewed the images performed during this visit and I agree with the Radiologist's read.   Interpretation by Radiologist:  CT ABDOMEN PELVIS W CONTRAST  Result Date: 09/12/2020 CLINICAL DATA:  Abdominal pain, unspecified. Alternating diarrhea and constipation EXAM: CT ABDOMEN AND PELVIS WITH CONTRAST TECHNIQUE: Multidetector CT imaging of the abdomen and pelvis was performed using the standard protocol following bolus administration of intravenous contrast. CONTRAST:  46m OMNIPAQUE IOHEXOL 300 MG/ML  SOLN COMPARISON:  10/24/2015 FINDINGS: Lower chest:  No contributory findings. Hepatobiliary: No focal liver abnormality.No evidence of biliary obstruction or stone. Pancreas: Unremarkable. Spleen: Unremarkable. Adrenals/Urinary Tract: Negative adrenals. No hydronephrosis or stone. Unremarkable bladder. Stomach/Bowel: No obstruction. Appendectomy. No visible bowel inflammation. Moderate distension of the colon by desiccated stool. Vascular/Lymphatic: No acute vascular abnormality. Heterogeneous density of the common femoral and external iliac veins is attributed to mixing artifact. No mass or adenopathy. Reproductive:Prominent size parametrial veins with distended left ovarian vein measuring up to 8  mm in diameter, incidental to the current presentation. Other: No ascites or pneumoperitoneum. Musculoskeletal: No acute abnormalities. IMPRESSION: No acute finding.  No visible bowel inflammation or obstruction. Electronically Signed   By: Monte Fantasia M.D.   On: 09/12/2020 04:58      ____________________________________________   PROCEDURES  Procedure(s) performed:yes .1-3 Lead EKG Interpretation Performed by: Rudene Re, MD Authorized by: Rudene Re, MD     Interpretation: non-specific     ECG rate assessment: tachycardic     Rhythm: sinus tachycardia     Ectopy: none     Critical Care  performed:  None ____________________________________________   INITIAL IMPRESSION / ASSESSMENT AND PLAN / ED COURSE  24 y.o. female history of type 1 diabetes who presents for evaluation of abdominal pain. Patient is chronically ill-appearing, looks dry on exam but in no significant distress, vitals are within normal limits, abdomen is nondistended with diffuse mild tenderness, no localized tenderness, rebound or guarding.  Ddx DKA, gastroparesis, SBO, diverticulitis, GB pathology, gastroenteritis, constipation, covid, UTI, pyelonephritis, ectopic, pregnancy, STD, PID.  Labs show no signs of DKA with blood glucose of 175, normal anion gap of 13, bicarb of 25. Minimal ketonuria with no signs of urinary tract infection or blood. Mildly elevated LFTs with no prior for comparison with normal T bili and alk phos, normal white count, normal lipase. Pregnancy test is negative  Plan for CT a/p, IV fentanyl, IV LR bolus, IV zofran and reassessment. Old medical records reviewed. Patient placed on telemetry for close monitoring.  _________________________ 7:11 AM on 09/12/2020 -----------------------------------------  CT visualized by me with no acute findings, confirmed by radiology.  After 2 L of LR, 10 units of IV insulin patient continues to have persistent pain, nausea, and her sugars are trending up.  Repeat BMP was done showing glucose of 421 with normal anion gap and normal bicarb.  No signs of DKA at this time.  Will give a 3rd L, start patient on maintenance fluids, redose nausea and pain medication with IV haldol for possible gastroparesis and admit to Hospitalist     _____________________________________________ Please note:  Patient was evaluated in Emergency Department today for the symptoms described in the history of present illness. Patient was evaluated in the context of the global COVID-19 pandemic, which necessitated consideration that the patient might be at risk for infection with  the SARS-CoV-2 virus that causes COVID-19. Institutional protocols and algorithms that pertain to the evaluation of patients at risk for COVID-19 are in a state of rapid change based on information released by regulatory bodies including the CDC and federal and state organizations. These policies and algorithms were followed during the patient's care in the ED.  Some ED evaluations and interventions may be delayed as a result of limited staffing during the pandemic.   [place] Controlled Substance Database was reviewed by me. ____________________________________________   FINAL CLINICAL IMPRESSION(S) / ED DIAGNOSES   Final diagnoses:  Intractable abdominal pain  Hyperglycemia due to type 1 diabetes mellitus ([place])      NEW MEDICATIONS STARTED DURING THIS VISIT:  ED Discharge Orders    None       Note:  This document was prepared using Dragon voice recognition software and may include unintentional dictation errors.    Alfred Levins, [place], MD 09/12/20 [phone]

## 2020-09-13 DIAGNOSIS — K5900 Constipation, unspecified: Secondary | ICD-10-CM | POA: Diagnosis present

## 2020-09-13 DIAGNOSIS — R112 Nausea with vomiting, unspecified: Secondary | ICD-10-CM

## 2020-09-13 DIAGNOSIS — R109 Unspecified abdominal pain: Secondary | ICD-10-CM | POA: Diagnosis present

## 2020-09-13 DIAGNOSIS — E1065 Type 1 diabetes mellitus with hyperglycemia: Secondary | ICD-10-CM | POA: Diagnosis present

## 2020-09-13 DIAGNOSIS — Z91128 Patient's intentional underdosing of medication regimen for other reason: Secondary | ICD-10-CM | POA: Diagnosis not present

## 2020-09-13 DIAGNOSIS — B1920 Unspecified viral hepatitis C without hepatic coma: Secondary | ICD-10-CM | POA: Diagnosis present

## 2020-09-13 DIAGNOSIS — Z9114 Patient's other noncompliance with medication regimen: Secondary | ICD-10-CM | POA: Diagnosis not present

## 2020-09-13 DIAGNOSIS — K3184 Gastroparesis: Secondary | ICD-10-CM | POA: Diagnosis present

## 2020-09-13 DIAGNOSIS — E1043 Type 1 diabetes mellitus with diabetic autonomic (poly)neuropathy: Secondary | ICD-10-CM | POA: Diagnosis present

## 2020-09-13 DIAGNOSIS — F17210 Nicotine dependence, cigarettes, uncomplicated: Secondary | ICD-10-CM | POA: Diagnosis present

## 2020-09-13 DIAGNOSIS — T383X6A Underdosing of insulin and oral hypoglycemic [antidiabetic] drugs, initial encounter: Secondary | ICD-10-CM | POA: Diagnosis present

## 2020-09-13 DIAGNOSIS — Z794 Long term (current) use of insulin: Secondary | ICD-10-CM | POA: Diagnosis not present

## 2020-09-13 DIAGNOSIS — E860 Dehydration: Secondary | ICD-10-CM | POA: Diagnosis present

## 2020-09-13 DIAGNOSIS — Z20822 Contact with and (suspected) exposure to covid-19: Secondary | ICD-10-CM | POA: Diagnosis present

## 2020-09-13 LAB — GLUCOSE, CAPILLARY
Glucose-Capillary: 413 mg/dL — ABNORMAL HIGH (ref 70–99)
Glucose-Capillary: 329 mg/dL — ABNORMAL HIGH (ref 70–99)
Glucose-Capillary: 152 mg/dL — ABNORMAL HIGH (ref 70–99)
Glucose-Capillary: 257 mg/dL — ABNORMAL HIGH (ref 70–99)
Glucose-Capillary: 89 mg/dL (ref 70–99)

## 2020-09-13 LAB — BASIC METABOLIC PANEL
Anion gap: 8 (ref 5–15)
BUN: 10 mg/dL (ref 6–20)
CO2: 29 mmol/L (ref 22–32)
Calcium: 9.1 mg/dL (ref 8.9–10.3)
Chloride: 98 mmol/L (ref 98–111)
Creatinine, Ser: 0.58 mg/dL (ref 0.44–1.00)
GFR, Estimated: >60 mL/min (ref >60)
Glucose, Bld: 401 mg/dL — ABNORMAL HIGH (ref 70–99)
Potassium: 4.2 mmol/L (ref 3.5–5.1)
Sodium: 135 mmol/L (ref 135–145)

## 2020-09-13 LAB — CBC
HCT: 39 % (ref 36.0–46.0)
HCT: 38 % (ref 36.0–46.0)
Hemoglobin: 13 g/dL (ref 12.0–15.0)
Hemoglobin: 12.5 g/dL (ref 12.0–15.0)
MCH: 32.3 pg (ref 26.0–34.0)
MCH: 32.1 pg (ref 26.0–34.0)
MCHC: 33.3 g/dL (ref 30.0–36.0)
MCHC: 32.9 g/dL (ref 30.0–36.0)
MCV: 96.8 fL (ref 80.0–100.0)
MCV: 97.4 fL (ref 80.0–100.0)
Platelets: 244 10*3/uL (ref 150–400)
Platelets: 253 10*3/uL (ref 150–400)
RBC: 4.03 MIL/uL (ref 3.87–5.11)
RBC: 3.9 MIL/uL (ref 3.87–5.11)
RDW: 12.2 % (ref 11.5–15.5)
RDW: 12.2 % (ref 11.5–15.5)
WBC: 5.5 10*3/uL (ref 4.0–10.5)
WBC: 7.2 10*3/uL (ref 4.0–10.5)
nRBC: 0 % (ref 0.0–0.2)
nRBC: 0 % (ref 0.0–0.2)

## 2020-09-13 LAB — HEPATITIS PANEL, ACUTE
HCV Ab: REACTIVE — AB
Hep A IgM: NONREACTIVE
Hep B C IgM: NONREACTIVE
Hepatitis B Surface Ag: NONREACTIVE

## 2020-09-13 LAB — HIV ANTIBODY (ROUTINE TESTING W REFLEX): HIV Screen 4th Generation wRfx: NONREACTIVE

## 2020-09-13 LAB — BETA-HYDROXYBUTYRIC ACID: Beta-Hydroxybutyric Acid: 0.3 mmol/L — ABNORMAL HIGH (ref 0.05–0.27)

## 2020-09-13 MED ORDER — SODIUM CHLORIDE 0.45 % IV SOLN: INTRAVENOUS | Status: DC

## 2020-09-13 MED ORDER — INSULIN GLARGINE 100 UNIT/ML ~~LOC~~ SOLN
30.0000 [IU] | Freq: Every day | SUBCUTANEOUS | Status: DC
  Administered 2020-09-13 – 2020-09-14 (×2): 30 [IU] via SUBCUTANEOUS
  Filled 2020-09-13 (×2): qty 0.3

## 2020-09-13 MED ORDER — LACTULOSE 10 GM/15ML PO SOLN
20.0000 g | Freq: Two times a day (BID) | ORAL | Status: DC
  Administered 2020-09-13 – 2020-09-14 (×3): 20 g via ORAL
  Filled 2020-09-13 (×3): qty 30

## 2020-09-13 NOTE — Progress Notes (Signed)
Inpatient Diabetes Program Recommendations  AACE/ADA: New Consensus Statement on Inpatient Glycemic Control (2015)  Target Ranges:  Prepandial:   less than 140 mg/dL      Peak postprandial:   less than 180 mg/dL (1-2 hours)      Critically ill patients:  140 - 180 mg/dL   Lab Results  Component Value Date   GLUCAP 329 (H) 09/13/2020   HGBA1C 11.6 (H) 09/12/2020    Review of Glycemic Control Results for WYLMA, TATEM (MRN [id number]) as of 09/13/2020 08:59  Ref. Range 09/12/2020 08:36 09/12/2020 11:28 09/12/2020 16:44 09/12/2020 21:37 09/13/2020 07:40 09/13/2020 09:00  Glucose-Capillary Latest Ref Range: 70 - 99 mg/dL 588 (H) 502 (H) 774 (H) 226 (H) 413 (H) 329 (H)  Diabetes history: DM 1 Outpatient Diabetes medications:  Lantus 30 units q HS, Novolog with meals (per chart review from [facility]: Patient will continue with NovoLog according to carb ratio of 10 however I have added correction factor of 100 with target blood sugar of 100. Advised the patient to take NovoLog 3 times daily)  Current orders for Inpatient glycemic control:  Novolog sensitive tid with meals and HS Lantus 30 units daily, Novolog 3 units tid with meals Inpatient Diabetes Program Recommendations:   Note CBG>400 mg/dL.  Labs pending.  Recommend the addition of 3 am blood sugar just to make sure blood sugar not dropping over night (Somogyi effect).  A1C indicates average blood sugars are 286 mg/dL.  This is an improvement from October 2021 when A1C was >15%.  Will follow.   Thanks  Beryl Meager, RN, BC-ADM Inpatient Diabetes Coordinator Pager [phone] (8a-5p)

## 2020-09-13 NOTE — Progress Notes (Signed)
PROGRESS NOTE  [name] UVO:[id number] DOB: October 27, 1995 DOA: 09/12/2020 PCP: Felipa Furnace, NP  Brief History   Rebecca Bryant is a 24 y.o. female with medical history significant of type 1 diabetes, tobacco abuse, who presents with nausea, vomiting, abdominal pain.  Patient states that she has been having nausea, vomiting, abdominal pain for almost 3 days. She has at least 3 times of nonbilious nonbloody vomiting each day.  Abdominal pain is located in the lower abdomen, constant, sharp, cramping, 8 out of 10 severity, nonradiating.  Patient denies diarrhea.  No fever or chills.  Patient does not have chest pain, shortness breath, cough.  No symptoms of UTI.  No hematuria.  ED Course: pt was found to have WBC 8.0, lipase 27, negative pregnancy test, negative Covid PCR, renal function okay, temperature normal, blood pressure 130/88, heart rate 119, 85, RR 18, oxygen saturation 98% on room air.  CT abdomen/pelvis is negative for acute intra-abdominal issues.  Patient is placed on MedSurg bed for observation.  She is on a carb modified diet with FSBS 413, 25 Units of lantus and SSI. Beta hydroxy butyric acid is very slightly positive. Patient admits that she does not take Lantus "all the time".  Consultants  . None  Procedures  . None  Antibiotics   Anti-infectives (From admission, onward)   None    .  Subjective  The patient is lying quietly. She is complaining of low abdominal pain. She continues to complain of nausea. Glucoses are very high.  Objective   Vitals:  Vitals:   09/13/20 0558 09/13/20 0742  BP: 133/83 139/87  Pulse: 86 92  Resp: 15 16  Temp: 98.9 F (37.2 C) 98.3 F (36.8 C)  SpO2: 98% 98%   Exam:  Constitutional:  . The patient is awake, alert, and oriented x 3. No acute distress. Respiratory:  . No increased work of breathing. . No wheezes, rales, or rhonchi . No tactile fremitus Cardiovascular:  . Regular rate and rhythm . No murmurs, ectopy,  or gallups. . No lateral PMI. No thrills. Abdomen:  . Abdomen is full feeling. Positve for low abdominal tenderness. No epgastric tenderness. Positive for mild distention. . No hernias, masses, or organomegaly . Hypoactive bowel sounds.  Musculoskeletal:  . No cyanosis, clubbing, or edema Skin:  . No rashes, lesions, ulcers . palpation of skin: no induration or nodules Neurologic:  . CN 2-12 intact . Sensation all 4 extremities intact Psychiatric:  . Mental status o Mood, affect appropriate o Orientation to person, place, time  . judgment and insight appear intact  I have personally reviewed the following:   Today's Data  . Vitals, CBC, BMP, Beta hydroxy-butiric acid, HbA1c, Glucoses  Imaging  . CT abdomen and pelvis 09/12/2020  Scheduled Meds: . enoxaparin (LOVENOX) injection  40 mg Subcutaneous Q24H  . insulin aspart  0-5 Units Subcutaneous QHS  . insulin aspart  0-9 Units Subcutaneous TID WC  . insulin aspart  3 Units Subcutaneous TID WC  . insulin glargine  25 Units Subcutaneous Daily  . metoCLOPramide (REGLAN) injection  5 mg Intravenous Q8H  . nicotine  21 mg Transdermal Daily   Continuous Infusions: . lactated ringers 100 mL/hr at 09/13/20 0500    Principal Problem:   Intractable nausea and vomiting Active Problems:   Diabetes mellitus without complication ([facility])   Abdominal pain   Tobacco abuse   LOS: 0 days   A & P  Abdominal pain/nausea/vomiting: Per patient unchanged since admission. CAT abdomen/pelvis  from 09/12/2020 demonstrated distended bowel due to dessicated stool as that only abnormality. This may be the reason for the patient's complaints. Will start lactulose. She has also been given Reglan, which she states has not helped. Probable diabetic gastroparesis.  Uncontrolled DM I: The patient's HbA1c is 11.6 which is improved over 15. Which was measured in PCP's office in September. She admits that she doesn't take Lantus "all of the time". This is  likely the cause of her uncontrolled glucoses and likely diabetic gastroparesis.  Tobacco abuse: Nicotine patch has been made available. This is a particularly poor life choice for this patient and exponentially worsens her prognosis from her poorly controlled DM on multiple fronts. She is strongly counseled to stop.  Elevated LFT's: Mild. Will check Hepatitis panel.   I have seen and examined this patient myself. I have spent 36 minutes in her evaluation and care.  Lekeith Wulf, DO [facility] Direct contact: see [email or web address]  7PM-7AM contact night coverage as above 09/13/2020, 8:41 AM  LOS: 0 days

## 2020-09-14 ENCOUNTER — Inpatient Hospital Stay: Payer: Medicaid Other

## 2020-09-14 LAB — CBC WITH DIFFERENTIAL/PLATELET
Abs Immature Granulocytes: 0.01 10*3/uL (ref 0.00–0.07)
Basophils Absolute: 0.1 10*3/uL (ref 0.0–0.1)
Basophils Relative: 2 %
Eosinophils Absolute: 0.2 10*3/uL (ref 0.0–0.5)
Eosinophils Relative: 3 %
HCT: 39.6 % (ref 36.0–46.0)
Hemoglobin: 13.5 g/dL (ref 12.0–15.0)
Immature Granulocytes: 0 %
Lymphocytes Relative: 32 %
Lymphs Abs: 1.9 10*3/uL (ref 0.7–4.0)
MCH: 32.4 pg (ref 26.0–34.0)
MCHC: 34.1 g/dL (ref 30.0–36.0)
MCV: 95 fL (ref 80.0–100.0)
Monocytes Absolute: 0.5 10*3/uL (ref 0.1–1.0)
Monocytes Relative: 9 %
Neutro Abs: 3.2 10*3/uL (ref 1.7–7.7)
Neutrophils Relative %: 54 %
Platelets: 276 10*3/uL (ref 150–400)
RBC: 4.17 MIL/uL (ref 3.87–5.11)
RDW: 12 % (ref 11.5–15.5)
WBC: 6 10*3/uL (ref 4.0–10.5)
nRBC: 0 % (ref 0.0–0.2)

## 2020-09-14 LAB — BASIC METABOLIC PANEL
Anion gap: 10 (ref 5–15)
BUN: 7 mg/dL (ref 6–20)
CO2: 26 mmol/L (ref 22–32)
Calcium: 9.4 mg/dL (ref 8.9–10.3)
Chloride: 98 mmol/L (ref 98–111)
Creatinine, Ser: 0.51 mg/dL (ref 0.44–1.00)
GFR, Estimated: >60 mL/min (ref >60)
Glucose, Bld: 318 mg/dL — ABNORMAL HIGH (ref 70–99)
Potassium: 4.2 mmol/L (ref 3.5–5.1)
Sodium: 134 mmol/L — ABNORMAL LOW (ref 135–145)

## 2020-09-14 LAB — GLUCOSE, CAPILLARY: Glucose-Capillary: 263 mg/dL — ABNORMAL HIGH (ref 70–99)

## 2020-09-14 MED ORDER — LANTUS SOLOSTAR 100 UNIT/ML ~~LOC~~ SOPN
30.0000 [IU] | PEN_INJECTOR | Freq: Every day | SUBCUTANEOUS | 0 refills | Status: DC
Start: 2020-09-14 — End: 2023-06-01

## 2020-09-14 MED ORDER — POLYETHYLENE GLYCOL 3350 17 G PO PACK: 17.0000 g | PACK | Freq: Every day | ORAL | 0 refills | Status: DC | PRN

## 2020-09-14 MED ORDER — INSULIN ASPART 100 UNIT/ML ~~LOC~~ SOLN
3.0000 [IU] | Freq: Three times a day (TID) | SUBCUTANEOUS | 11 refills | Status: DC
Start: 2020-09-14 — End: 2024-05-06

## 2020-09-14 MED ORDER — NICOTINE 21 MG/24HR TD PT24
21.0000 mg | MEDICATED_PATCH | Freq: Every day | TRANSDERMAL | 0 refills | Status: DC
Start: 2020-09-15 — End: 2023-06-01

## 2020-09-14 NOTE — Discharge Summary (Signed)
Physician Discharge Summary  Rebecca Bryant HBZ:[id number] DOB: May 27, 1996 DOA: 09/12/2020  PCP: Knox Royalty D, NP  Admit date: 09/12/2020 Discharge date: 09/14/2020  Recommendations for Outpatient Follow-up:  1. Discharge to home. 2. Check FSBS each morning before breakfast and each evening before dinner. Record these glucoses and take with you to visit with PCP. 3. Follow up with PCP in 7-10 days. 4. Pt will need referral to infectious disease for Hepatitis C.  Discharge Diagnoses: Principal diagnosis is #1 1. Abdominal pain/nausea/vomiting 2. Uncontrolled DM I 3. Tobacco abuse 4. Elevated LFT's 5. Hepatitis C 6. Constipation  Discharge Condition: Fair  Disposition: Home  Diet recommendation: Modified carbohydrate diet  Filed Weights   09/11/20 1812  Weight: 56.7 kg    History of present illness: Rebecca Bryant is a 24 y.o. female with medical history significant of type 1 diabetes, tobacco abuse, who presents with nausea, vomiting, abdominal pain.  Patient states that she has been having nausea, vomiting, abdominal pain for almost 3 days. She has at least 3 times of nonbilious nonbloody vomiting each day.  Abdominal pain is located in the lower abdomen, constant, sharp, cramping, 8 out of 10 severity, nonradiating.  Patient denies diarrhea.  No fever or chills.  Patient does not have chest pain, shortness breath, cough.  No symptoms of UTI.  No hematuria.  ED Course: pt was found to have WBC 8.0, lipase 27, negative pregnancy test, negative Covid PCR, renal function okay, temperature normal, blood pressure 130/88, heart rate 119, 85, RR 18, oxygen saturation 98% on room air.  CT abdomen/pelvis is negative for acute intra-abdominal issues.  Patient is placed on MedSurg bed for observation.  Hospital Course:  The patient was admitted to a medical bed. Her glucoses were brought under control. She was given IV fluids. She was given lactulose to relieve her constipation. Today  she was able to tolerate a diabetic diet. She will be discharged to home in good condition.   During her stay it became clear that the patient was rarely using her lantus. I have discussed this with the patient. She states that she understands that must take her Lantus.  Today's assessment: S: The patient is awake alert and oriented x 3. No new complaints. O: Vitals:  Vitals:   09/14/20 0511 09/14/20 0746  BP: 114/73 107/72  Pulse: 85 84  Resp: 18 16  Temp: 98.5 F (36.9 C) 97.8 F (36.6 C)  SpO2: 97% 99%   Exam:  Constitutional:  . The patient is awake, alert, and oriented x 3. No acute distress. Respiratory:  . No increased work of breathing. . No wheezes, rales, or rhonchi . No tactile fremitus Cardiovascular:  . Regular rate and rhythm . No murmurs, ectopy, or gallups. . No lateral PMI. No thrills. Abdomen:  . Abdomen is soft, non-tender, non-distended . No hernias, masses, or organomegaly . Normoactive bowel sounds.  Musculoskeletal:  . No cyanosis, clubbing, or edema Skin:  . No rashes, lesions, ulcers . palpation of skin: no induration or nodules Neurologic:  . CN 2-12 intact . Sensation all 4 extremities intact Psychiatric:  . Mental status o Mood, affect appropriate o Orientation to person, place, time  . judgment and insight appear intact   Discharge Instructions  Discharge Instructions    Activity as tolerated - No restrictions   Complete by: As directed    Call MD for:  persistant nausea and vomiting   Complete by: As directed    Call MD for:  temperature >100.4  Complete by: As directed    Diet - low sodium heart healthy   Complete by: As directed    Discharge instructions   Complete by: As directed    Discharge to home. Check FSBS each morning before breakfast and each evening before dinner. Record these glucoses and take with you to visit with PCP. Follow up with PCP in 7-10 days.   Increase activity slowly   Complete by: As directed       Allergies as of 09/14/2020   No Known Allergies     Medication List    TAKE these medications   Dexcom G6 Receiver Devi Use as directed for continuous glucose monitoring.   Dexcom G6 Receiver Devi USE AS DIRECTED FOR CONTINOUS GLUCOSE MONITORING   insulin aspart 100 UNIT/ML injection Commonly known as: novoLOG Inject 3 Units into the skin 3 (three) times daily with meals. What changed:   See the new instructions.  Another medication with the same name was removed. Continue taking this medication, and follow the directions you see here.   Lantus SoloStar 100 UNIT/ML Solostar Pen Generic drug: insulin glargine Inject 30 Units into the skin at bedtime.   nicotine 21 mg/24hr patch Commonly known as: NICODERM CQ - dosed in mg/24 hours Place 1 patch (21 mg total) onto the skin daily. Start taking on: September 15, 2020   polyethylene glycol 17 g packet Commonly known as: MIRALAX / GLYCOLAX Take 17 g by mouth daily as needed for mild constipation.      No Known Allergies  The results of significant diagnostics from this hospitalization (including imaging, microbiology, ancillary and laboratory) are listed below for reference.    Significant Diagnostic Studies: CT ABDOMEN PELVIS W CONTRAST  Result Date: 09/12/2020 CLINICAL DATA:  Abdominal pain, unspecified. Alternating diarrhea and constipation EXAM: CT ABDOMEN AND PELVIS WITH CONTRAST TECHNIQUE: Multidetector CT imaging of the abdomen and pelvis was performed using the standard protocol following bolus administration of intravenous contrast. CONTRAST:  26mL OMNIPAQUE IOHEXOL 300 MG/ML  SOLN COMPARISON:  10/24/2015 FINDINGS: Lower chest:  No contributory findings. Hepatobiliary: No focal liver abnormality.No evidence of biliary obstruction or stone. Pancreas: Unremarkable. Spleen: Unremarkable. Adrenals/Urinary Tract: Negative adrenals. No hydronephrosis or stone. Unremarkable bladder. Stomach/Bowel: No obstruction.  Appendectomy. No visible bowel inflammation. Moderate distension of the colon by desiccated stool. Vascular/Lymphatic: No acute vascular abnormality. Heterogeneous density of the common femoral and external iliac veins is attributed to mixing artifact. No mass or adenopathy. Reproductive:Prominent size parametrial veins with distended left ovarian vein measuring up to 8 mm in diameter, incidental to the current presentation. Other: No ascites or pneumoperitoneum. Musculoskeletal: No acute abnormalities. IMPRESSION: No acute finding.  No visible bowel inflammation or obstruction. Electronically Signed   By: Marnee Spring M.D.   On: 09/12/2020 04:58   US Abdomen Limited RUQ (LIVER/GB)  Result Date: 09/14/2020 CLINICAL DATA:  Elevated liver function tests. EXAM: ULTRASOUND ABDOMEN LIMITED RIGHT UPPER QUADRANT COMPARISON:  CT 09/12/2020 FINDINGS: Gallbladder: No gallstones or wall thickening visualized. No sonographic Murphy sign noted by sonographer. Common bile duct: Diameter: Upper limits of normal in size.  No visible ductal stone. Liver: No focal lesion identified. Within normal limits in parenchymal echogenicity. Portal vein is patent on color Doppler imaging with normal direction of blood flow towards the liver. Other: None. IMPRESSION: Normal examination. No abnormality seen to explain the laboratory abnormality. Common duct is at the upper limits of normal in size but there are no visible gallstones or ductal stones. Electronically Signed  By: Paulina Fusi M.D.   On: 09/14/2020 09:13    Microbiology: Recent Results (from the past 240 hour(s))  Resp Panel by RT-PCR (Flu A&B, Covid) Nasopharyngeal Swab     Status: None   Collection Time: 09/12/20  2:32 AM   Specimen: Nasopharyngeal Swab; Nasopharyngeal(NP) swabs in vial transport medium  Result Value Ref Range Status   SARS Coronavirus 2 by RT PCR NEGATIVE NEGATIVE Final    Comment: (NOTE) SARS-CoV-2 target nucleic acids are NOT DETECTED.  The  SARS-CoV-2 RNA is generally detectable in upper respiratory specimens during the acute phase of infection. The lowest concentration of SARS-CoV-2 viral copies this assay can detect is 138 copies/mL. A negative result does not preclude SARS-Cov-2 infection and should not be used as the sole basis for treatment or other patient management decisions. A negative result may occur with  improper specimen collection/handling, submission of specimen other than nasopharyngeal swab, presence of viral mutation(s) within the areas targeted by this assay, and inadequate number of viral copies(<138 copies/mL). A negative result must be combined with clinical observations, patient history, and epidemiological information. The expected result is Negative.  Fact Sheet for Patients:  BloggerCourse.com  Fact Sheet for Healthcare Providers:  SeriousBroker.it  This test is no t yet approved or cleared by the [place] FDA and  has been authorized for detection and/or diagnosis of SARS-CoV-2 by FDA under an Emergency Use Authorization (EUA). This EUA will remain  in effect (meaning this test can be used) for the duration of the COVID-19 declaration under Section 564(b)(1) of the Act, 21 U.S.C.section 360bbb-3(b)(1), unless the authorization is terminated  or revoked sooner.       Influenza A by PCR NEGATIVE NEGATIVE Final   Influenza B by PCR NEGATIVE NEGATIVE Final    Comment: (NOTE) The Xpert Xpress SARS-CoV-2/FLU/RSV plus assay is intended as an aid in the diagnosis of influenza from Nasopharyngeal swab specimens and should not be used as a sole basis for treatment. Nasal washings and aspirates are unacceptable for Xpert Xpress SARS-CoV-2/FLU/RSV testing.  Fact Sheet for Patients: BloggerCourse.com  Fact Sheet for Healthcare Providers: SeriousBroker.it  This test is not yet approved or  cleared by the [place] FDA and has been authorized for detection and/or diagnosis of SARS-CoV-2 by FDA under an Emergency Use Authorization (EUA). This EUA will remain in effect (meaning this test can be used) for the duration of the COVID-19 declaration under Section 564(b)(1) of the Act, 21 U.S.C. section 360bbb-3(b)(1), unless the authorization is terminated or revoked.  Performed at [facility], [address] [place], [place] [address]      Labs: Basic Metabolic Panel: Recent Labs  Lab 09/11/20 1819 09/12/20 0555 09/13/20 0530 09/14/20 0437  NA 138 132* 135 134*  K 3.7 4.8 4.2 4.2  CL 100 98 98 98  CO2 25 24 29 26   GLUCOSE 175* 421* 401* 318*  BUN 10 11 10 7   CREATININE 0.82 0.55 0.58 0.51  CALCIUM 9.2 9.1 9.1 9.4   Liver Function Tests: Recent Labs  Lab 09/11/20 1819  AST 71*  ALT 97*  ALKPHOS 59  BILITOT 0.6  PROT 7.4  ALBUMIN 3.8   Recent Labs  Lab 09/11/20 1819 09/12/20 0718  LIPASE 27 27   No results for input(s): AMMONIA in the last 168 hours. CBC: Recent Labs  Lab 09/11/20 1819 09/13/20 0531 09/13/20 0920 09/14/20 0437  WBC 8.0 5.5 7.2 6.0  NEUTROABS  --   --   --  3.2  HGB 13.1 13.0 12.5 13.5  HCT 39.4 39.0 38.0 39.6  MCV 97.0 96.8 97.4 95.0  PLT 291 244 253 276   Cardiac Enzymes: No results for input(s): CKTOTAL, CKMB, CKMBINDEX, TROPONINI in the last 168 hours. BNP: BNP (last 3 results) No results for input(s): BNP in the last 8760 hours.  ProBNP (last 3 results) No results for input(s): PROBNP in the last 8760 hours.  CBG: Recent Labs  Lab 09/13/20 0900 09/13/20 1139 09/13/20 1700 09/13/20 2023 09/14/20 0748  GLUCAP 329* 152* 257* 89 263*    Principal Problem:   Intractable nausea and vomiting Active Problems:   Diabetes mellitus without complication (HCC)   Abdominal pain   Tobacco abuse   Time coordinating discharge: 38 minutes.  Signed:        Dealva Lafoy, DO [facility]  09/14/2020, 5:01 PM

## 2020-09-14 NOTE — Discharge Instructions (Signed)
Abdominal Pain, Adult Many things can cause belly (abdominal) pain. Most times, belly pain is not dangerous. Many cases of belly pain can be watched and treated at home. Sometimes, though, belly pain is serious. Your doctor will try to find the cause of your belly pain. Follow these instructions at home:  Medicines  Take over-the-counter and prescription medicines only as told by your doctor.  Do not take medicines that help you poop (laxatives) unless told by your doctor. General instructions  Watch your belly pain for any changes.  Drink enough fluid to keep your pee (urine) pale yellow.  Keep all follow-up visits as told by your doctor. This is important. Contact a doctor if:  Your belly pain changes or gets worse.  You are not hungry, or you lose weight without trying.  You are having trouble pooping (constipated) or have watery poop (diarrhea) for more than 2-3 days.  You have pain when you pee or poop.  Your belly pain wakes you up at night.  Your pain gets worse with meals, after eating, or with certain foods.  You are vomiting and cannot keep anything down.  You have a fever.  You have blood in your pee. Get help right away if:  Your pain does not go away as soon as your doctor says it should.  You cannot stop vomiting.  Your pain is only in areas of your belly, such as the right side or the left lower part of the belly.  You have bloody or black poop, or poop that looks like tar.  You have very bad pain, cramping, or bloating in your belly.  You have signs of not having enough fluid or water in your body (dehydration), such as: ? Dark pee, very little pee, or no pee. ? Cracked lips. ? Dry mouth. ? Sunken eyes. ? Sleepiness. ? Weakness.  You have trouble breathing or chest pain. Summary  Many cases of belly pain can be watched and treated at home.  Watch your belly pain for any changes.  Take over-the-counter and prescription medicines only as  told by your doctor.  Contact a doctor if your belly pain changes or gets worse.  Get help right away if you have very bad pain, cramping, or bloating in your belly. This information is not intended to replace advice given to you by your health care provider. Make sure you discuss any questions you have with your health care provider. Document Revised: 02/01/2019 Document Reviewed: 02/01/2019 Elsevier Patient Education  [place].   Correction Insulin  Correction insulin, also called corrective insulin or a supplemental dose, is a small amount of insulin that can be used to lower your blood sugar (glucose) if it is too high. You may be instructed to check your blood glucose at certain times of the day and to use correction insulin as needed to lower your blood glucose to your target range. Correction insulin is primarily used as part of diabetes management. It may also be prescribed for people who do not have diabetes. What is a correction scale? A correction scale, also called a sliding scale, is prescribed by your health care provider to help you determine when you need correction insulin. Your correction scale is based on your individual treatment goals, and it has two parts:  Ranges of blood glucose levels.  How much correction insulin to give yourself if your blood sugar falls within a certain range. If your blood glucose is in your desired range, you will  not need correction insulin and you should take your normal insulin dose. What type of insulin do I need? Your health care provider may prescribe rapid-acting or short-acting insulin for you to use as correction insulin. Rapid-acting insulin:  Starts working quickly, in as little as 5 minutes.  Can last for 3-6 hours.  Works well when taken right before a meal to quickly lower blood glucose. Short-acting insulin:  Starts working in about 30 minutes.  Can last for 6-8 hours.  Should be taken about 30 minutes before  you start eating a meal. Talk with your health care provider or pharmacist about which type of correction insulin to take and when to take it. If you use insulin to control your diabetes, you should use correction insulin in addition to the longer-acting (basal) insulin that you normally use. How do I manage my blood glucose with correction insulin? Giving a correction dose  Check your blood glucose as directed by your health care provider.  Use your correction scale to find the range that your blood glucose is in.  Identify the units of insulin that match your blood glucose range.  Make sure you have food available that you can eat in the next 15-30 minutes, after your correction dose.  Give yourself the dose of correction insulin that your health care provider has prescribed in your correction scale. Always make sure you are using the right type of insulin. ? If your correction insulin is rapid-acting, start eating a meal within 15 minutes after your correction dose to keep your blood glucose from getting too low. ? If your correction insulin is short-acting, start eating a meal within 30 minutes after your correction dose to keep your blood glucose from getting too low. Keeping a blood glucose log   Write down your blood glucose test results and the amount of insulin that you give yourself. Do this every time you check blood glucose or take insulin. Bring this log with you to your medical visits. This information will help your health care provider to manage your medicines.  Note anything that may affect your blood glucose, such as: ? Changes in normal exercise or activity. ? Changes in your normal schedule, such as changes in your sleep routine, going on vacation, changing your diet, or holidays. ? New over-the-counter or prescription medicines. ? Illness, stress, or anxiety. ? Changes in the time that you took your medicine or insulin. ? Changes in your meals, such as skipping a meal,  having a late meal, or dining out. ? Eating things that may affect blood glucose, such as snacks, meal portions that are larger than normal, drinks that contain sugar, or eating less than usual. What do I need to know about hyperglycemia and hypoglycemia? What is hyperglycemia? Hyperglycemia, also called high blood glucose, occurs when blood glucose is too high. Make sure you know the early signs of hyperglycemia, such as:  Increased thirst.  Hunger.  Feeling very tired.  Needing to urinate more often than usual.  Blurry vision. What is hypoglycemia? Hypoglycemia is also called low blood glucose. Be aware of "stacking" your insulin doses. This happens when you correct a high blood glucose by giving yourself extra insulin too soon after a previous correction dose or mealtime dose. This may cause you to have too much insulin in your body and may put you at risk for hypoglycemia. Hypoglycemia occurs with a blood glucose level at or below 70 mg/dL (3.9 mmol/L). It is important to know the symptoms  of hypoglycemia and treat it right away. Always have a 15-gram rapid-acting carbohydrate snack with you to treat low blood glucose. Family members and close friends should also know the symptoms and should understand how to treat hypoglycemia, in case you are not able to treat yourself. What are the symptoms of hypoglycemia? Hypoglycemia symptoms can include:  Hunger.  Anxiety.  Sweating and feeling clammy.  Confusion.  Dizziness or light-headedness.  Sleepiness.  Nausea.  Increased heart rate.  Headache.  Blurry vision.  Jerky movements that you cannot control (seizure).  Nightmares.  Tingling or numbness around the mouth, lips, or tongue.  A change in speech.  Decreased ability to concentrate.  A change in coordination.  Restless sleep.  Tremors or shakes.  Fainting.  Irritability. How do I treat hypoglycemia? If you are alert and able to swallow safely, follow the  15:15 rule:  Take 15 grams of a rapid-acting carbohydrate. Rapid-acting options include: ? 1 tube of glucose gel. ? 3 glucose pills. ? 6-8 pieces of hard candy. ? 4 oz (120 mL) of fruit juice. ? 4 oz (120 mL) of regular (not diet) soda.  Check your blood glucose 15 minutes after you take the carbohydrate. ? If the repeat blood glucose level is still at or below 70 mg/dL (3.9 mmol/L), take 15 grams of a carbohydrate again. ? If your blood glucose level does not increase above 70 mg/dL (3.9 mmol/L) after 3 tries, seek emergency medical care.  After your blood glucose level returns to normal, eat a meal or a snack within 1 hour.  How do I treat severe hypoglycemia? Severe hypoglycemia is when your blood glucose level is at or below 54 mg/dL (3 mmol/L). Severe hypoglycemia is an emergency. Do not wait to see if the symptoms will go away. Get medical help right away. Call your local emergency services (911 in the [place] Do not drive yourself to the hospital. If you have severe hypoglycemia and you cannot eat or drink, you may need an injection of glucagon. A family member or close friend should learn how to check your blood glucose and how to give you a glucagon injection. Ask your health care provider if you need to have an emergency glucagon injection kit available. Severe hypoglycemia may need to be treated in a hospital. The treatment may include getting glucose through an IV tube. You may also need treatment for the cause of your hypoglycemia. Why do I need correction insulin if I do not have diabetes? If you do not have diabetes, your health care provider may prescribe insulin because:  Keeping your blood glucose in the target range is important for your overall health.  You are taking medicines that cause your blood glucose to be higher than normal. Contact a health care provider if:  You develop low blood glucose that you are not able to treat yourself.  You have high blood glucose  that you are not able to correct with correction insulin.  Your blood glucose is often too low.  You used emergency glucagon to treat low blood glucose. Get help right away if:  You become unresponsive. If this happens, someone else should call emergency services (911 in the [place] right away.  Your blood glucose is lower than 54 mg/dL (3.0 mmol/L).  You become confused or you have trouble thinking clearly.  You have difficulty breathing. Summary  Correction insulin is a small amount of insulin that can be used to lower your blood sugar (glucose) if it is  too high.  Talk with your health care provider or pharmacist about which type of correction insulin to take and when to take it. If you use insulin to control your diabetes, you should use correction insulin in addition to the longer-acting (basal) insulin that you normally use.  You may be instructed to check your blood glucose at certain times of the day and to use correction insulin as needed to lower your blood glucose to your target range. Always keep a log of your blood glucose values and the amount of insulin that you used.  It is important to know the symptoms of hypoglycemia and treat it right away. Always have a 15-gram rapid-acting carbohydrate snack with you to treat low blood glucose. Family members and close friends should also know the symptoms and should understand how to treat hypoglycemia, in case you are not able to treat yourself. This information is not intended to replace advice given to you by your health care provider. Make sure you discuss any questions you have with your health care provider. Document Revised: 10/03/2016 Document Reviewed: 06/21/2016 Elsevier Patient Education  [place].   Abdominal Pain, Adult Pain in the abdomen (abdominal pain) can be caused by many things. Often, abdominal pain is not serious and it gets better with no treatment or by being treated at home. However, sometimes  abdominal pain is serious. Your health care provider will ask questions about your medical history and do a physical exam to try to determine the cause of your abdominal pain. Follow these instructions at home:  Medicines  Take over-the-counter and prescription medicines only as told by your health care provider.  Do not take a laxative unless told by your health care provider. General instructions  Watch your condition for any changes.  Drink enough fluid to keep your urine pale yellow.  Keep all follow-up visits as told by your health care provider. This is important. Contact a health care provider if:  Your abdominal pain changes or gets worse.  You are not hungry or you lose weight without trying.  You are constipated or have diarrhea for more than 2-3 days.  You have pain when you urinate or have a bowel movement.  Your abdominal pain wakes you up at night.  Your pain gets worse with meals, after eating, or with certain foods.  You are vomiting and cannot keep anything down.  You have a fever.  You have blood in your urine. Get help right away if:  Your pain does not go away as soon as your health care provider told you to expect.  You cannot stop vomiting.  Your pain is only in areas of the abdomen, such as the right side or the left lower portion of the abdomen. Pain on the right side could be caused by appendicitis.  You have bloody or black stools, or stools that look like tar.  You have severe pain, cramping, or bloating in your abdomen.  You have signs of dehydration, such as: ? Dark urine, very little urine, or no urine. ? Cracked lips. ? Dry mouth. ? Sunken eyes. ? Sleepiness. ? Weakness.  You have trouble breathing or chest pain. Summary  Often, abdominal pain is not serious and it gets better with no treatment or by being treated at home. However, sometimes abdominal pain is serious.  Watch your condition for any changes.  Take  over-the-counter and prescription medicines only as told by your health care provider.  Contact a health care  provider if your abdominal pain changes or gets worse.  Get help right away if you have severe pain, cramping, or bloating in your abdomen. This information is not intended to replace advice given to you by your health care provider. Make sure you discuss any questions you have with your health care provider. Document Revised: 02/01/2019 Document Reviewed: 02/01/2019 Elsevier Patient Education  [place].   Flank Pain, Adult Flank pain is pain in your side. The flank is the area of your side between your upper belly (abdomen) and your back. The pain may occur over a short time (acute), or it may be long-term or come back often (chronic). It may be mild or very bad. Pain in this area can be caused by many different things. Follow these instructions at home:   Drink enough fluid to keep your pee (urine) clear or pale yellow.  Rest as told by your doctor.  Take over-the-counter and prescription medicines only as told by your doctor.  Keep a journal to keep track of: ? What has caused your flank pain. ? What has made it feel better.  Keep all follow-up visits as told by your doctor. This is important. Contact a doctor if:  Medicine does not help your pain.  You have new symptoms.  Your pain gets worse.  You have a fever.  Your symptoms last longer than 2-3 days.  You have trouble peeing.  You are peeing more often than normal. Get help right away if:  You have trouble breathing.  You are short of breath.  Your belly hurts, or it is swollen or red.  You feel sick to your stomach (nauseous).  You throw up (vomit).  You feel like you will pass out, or you do pass out (faint).  You have blood in your pee. Summary  Flank pain is pain in your side. The flank is the area of your side between your upper belly (abdomen) and your back.  Flank pain may  occur over a short time (acute), or it may be long-term or come back often (chronic). It may be mild or very bad.  Pain in this area can be caused by many different things.  Contact your doctor if your symptoms get worse or they last longer than 2-3 days. This information is not intended to replace advice given to you by your health care provider. Make sure you discuss any questions you have with your health care provider. Document Revised: 09/05/2017 Document Reviewed: 01/13/2017 Elsevier Patient Education  [place].

## 2020-09-16 LAB — HCV RNA QUANT RFLX ULTRA OR GENOTYP
HCV RNA Qnt(log copy/mL): 5.474 log10 IU/mL
HepC Qn: 298000 IU/mL

## 2020-09-16 LAB — HEPATITIS C GENOTYPE

## 2020-09-22 ENCOUNTER — Other Ambulatory Visit: Payer: Self-pay

## 2020-09-22 ENCOUNTER — Encounter: Payer: Self-pay | Admitting: Emergency Medicine

## 2020-09-22 DIAGNOSIS — E119 Type 2 diabetes mellitus without complications: Secondary | ICD-10-CM | POA: Diagnosis not present

## 2020-09-22 DIAGNOSIS — F17210 Nicotine dependence, cigarettes, uncomplicated: Secondary | ICD-10-CM | POA: Diagnosis not present

## 2020-09-22 DIAGNOSIS — Z794 Long term (current) use of insulin: Secondary | ICD-10-CM | POA: Diagnosis not present

## 2020-09-22 DIAGNOSIS — R1084 Generalized abdominal pain: Secondary | ICD-10-CM | POA: Insufficient documentation

## 2020-09-22 DIAGNOSIS — R109 Unspecified abdominal pain: Secondary | ICD-10-CM | POA: Diagnosis present

## 2020-09-22 LAB — COMPREHENSIVE METABOLIC PANEL
ALT: 60 U/L — ABNORMAL HIGH (ref 0–44)
AST: 43 U/L — ABNORMAL HIGH (ref 15–41)
Albumin: 4.2 g/dL (ref 3.5–5.0)
Alkaline Phosphatase: 49 U/L (ref 38–126)
Anion gap: 10 (ref 5–15)
BUN: 9 mg/dL (ref 6–20)
CO2: 27 mmol/L (ref 22–32)
Calcium: 9.9 mg/dL (ref 8.9–10.3)
Chloride: 101 mmol/L (ref 98–111)
Creatinine, Ser: 0.53 mg/dL (ref 0.44–1.00)
GFR, Estimated: >60 mL/min (ref >60)
Glucose, Bld: 93 mg/dL (ref 70–99)
Potassium: 3.4 mmol/L — ABNORMAL LOW (ref 3.5–5.1)
Sodium: 138 mmol/L (ref 135–145)
Total Bilirubin: 0.9 mg/dL (ref 0.3–1.2)
Total Protein: 7.9 g/dL (ref 6.5–8.1)

## 2020-09-22 LAB — CBC
HCT: 40.3 % (ref 36.0–46.0)
Hemoglobin: 13.5 g/dL (ref 12.0–15.0)
MCH: 31.6 pg (ref 26.0–34.0)
MCHC: 33.5 g/dL (ref 30.0–36.0)
MCV: 94.4 fL (ref 80.0–100.0)
Platelets: 369 10*3/uL (ref 150–400)
RBC: 4.27 MIL/uL (ref 3.87–5.11)
RDW: 12.2 % (ref 11.5–15.5)
WBC: 9.1 10*3/uL (ref 4.0–10.5)
nRBC: 0 % (ref 0.0–0.2)

## 2020-09-22 LAB — URINALYSIS, COMPLETE (UACMP) WITH MICROSCOPIC
Bilirubin Urine: NEGATIVE
Glucose, UA: NEGATIVE mg/dL
Hgb urine dipstick: NEGATIVE
Ketones, ur: NEGATIVE mg/dL
Nitrite: NEGATIVE
Protein, ur: NEGATIVE mg/dL
Specific Gravity, Urine: 1.015 (ref 1.005–1.030)
pH: 7 (ref 5.0–8.0)

## 2020-09-22 LAB — CBG MONITORING, ED: Glucose-Capillary: 92 mg/dL (ref 70–99)

## 2020-09-22 LAB — POC URINE PREG, ED: Preg Test, Ur: NEGATIVE

## 2020-09-22 LAB — LIPASE, BLOOD: Lipase: 24 U/L (ref 11–51)

## 2020-09-22 NOTE — ED Notes (Signed)
First rn report: abd pain for a week per ems, seen prior in ed for same, fsbs 251. 102 nsr per ems. Feels better per ems if heat is applied.

## 2020-09-22 NOTE — ED Triage Notes (Signed)
Pt presents to the ER with complaints of abdominal pain, back pain, nausea denies any episodes of emesis. Pt is type 1 diabetic, CBG running around 200. Pt  Reports she was in the ER about a week ago with same symptoms. Pt talks in complete sentences no distress noted

## 2020-09-23 ENCOUNTER — Emergency Department
Admission: EM | Admit: 2020-09-23 | Discharge: 2020-09-23 | Disposition: A | Discharge status: Home / Self Care | Payer: Medicaid Other | Attending: Emergency Medicine | Admitting: Emergency Medicine

## 2020-09-23 ENCOUNTER — Emergency Department: Payer: Medicaid Other

## 2020-09-23 DIAGNOSIS — R1084 Generalized abdominal pain: Secondary | ICD-10-CM

## 2020-09-23 LAB — CBG MONITORING, ED
Glucose-Capillary: 181 mg/dL — ABNORMAL HIGH (ref 70–99)
Glucose-Capillary: 161 mg/dL — ABNORMAL HIGH (ref 70–99)

## 2020-09-23 MED ORDER — OXYCODONE-ACETAMINOPHEN 5-325 MG PO TABS: 1.0000 | ORAL_TABLET | ORAL | 0 refills | Status: DC | PRN

## 2020-09-23 MED ORDER — ONDANSETRON 4 MG PO TBDP: 4.0000 mg | ORAL_TABLET | Freq: Three times a day (TID) | ORAL | 0 refills | Status: DC | PRN

## 2020-09-23 MED ORDER — HALOPERIDOL LACTATE 5 MG/ML IJ SOLN
5.0000 mg | Freq: Once | INTRAMUSCULAR | Status: AC
  Administered 2020-09-23: 01:00:00 5 mg via INTRAVENOUS

## 2020-09-23 MED ORDER — OXYCODONE HCL 5 MG PO TABS
5.0000 mg | ORAL_TABLET | Freq: Once | ORAL | Status: AC
  Administered 2020-09-23: 02:00:00 5 mg via ORAL
  Filled 2020-09-23: qty 1

## 2020-09-23 MED ORDER — LACTATED RINGERS IV BOLUS
1000.0000 mL | Freq: Once | INTRAVENOUS | Status: AC
  Administered 2020-09-23: 01:00:00 1000 mL via INTRAVENOUS

## 2020-09-23 MED ORDER — ACETAMINOPHEN 500 MG PO TABS
1000.0000 mg | ORAL_TABLET | Freq: Once | ORAL | Status: AC
  Administered 2020-09-23: 02:00:00 1000 mg via ORAL
  Filled 2020-09-23: qty 2

## 2020-09-23 MED ORDER — HALOPERIDOL LACTATE 5 MG/ML IJ SOLN
INTRAMUSCULAR | Status: AC
  Filled 2020-09-23: qty 1

## 2020-09-23 NOTE — Discharge Instructions (Signed)

## 2020-09-23 NOTE — ED Provider Notes (Signed)
[facility] Emergency Department Provider Note  ____________________________________________  Time seen: Approximately 12:55 AM  I have reviewed the triage vital signs and the nursing notes.   HISTORY  Chief Complaint Abdominal Pain   HPI Rebecca Bryant is a 24 y.o. female with a history of type 1 diabetes, chronic abdominal pain who presents for evaluation of exacerbation of her abdominal pain.  Patient was seen here in the beginning of the month for the same and was admitted for a few days due to intractable pain and nausea and constipation.  Has been taking MiraLAX and milk of magnesia at home.  Has not had a bowel movement in 2 days.  She reports 3 days ago the pain started again.  The pain is diffuse, sharp, severe, constant associated with severe nausea.  No vomiting, no diarrhea, no fever or chills, no dysuria or hematuria.  Patient reports that her sugars have been stable at home.  Patient has had an appendectomy but no other surgeries.  Endorses compliance with her insulin.   Past Medical History:  Diagnosis Date  . Diabetes mellitus without complication [facility])     Patient Active Problem List   Diagnosis Date Noted  . Intractable nausea and vomiting 09/12/2020  . Diabetes mellitus without complication (HCC)   . Abdominal pain   . Tobacco abuse   . Intractable abdominal pain     Past Surgical History:  Procedure Laterality Date  . APPENDECTOMY      Prior to Admission medications   Medication Sig Start Date End Date Taking? Authorizing Provider  Continuous Blood Gluc Receiver (DEXCOM G6 RECEIVER) DEVI Use as directed for continuous glucose monitoring. 07/18/20   [provider]  Continuous Blood Gluc Receiver (DEXCOM G6 RECEIVER) DEVI USE AS DIRECTED FOR CONTINOUS GLUCOSE MONITORING 08/03/20   [provider]  insulin aspart (NOVOLOG) 100 UNIT/ML injection Inject 3 Units into the skin 3 (three) times daily with meals. 09/14/20    Swayze, Ava, DO  LANTUS SOLOSTAR 100 UNIT/ML Solostar Pen Inject 30 Units into the skin at bedtime. 09/14/20   Swayze, Ava, DO  nicotine (NICODERM CQ - DOSED IN MG/24 HOURS) 21 mg/24hr patch Place 1 patch (21 mg total) onto the skin daily. 09/15/20   Swayze, Ava, DO  ondansetron (ZOFRAN ODT) 4 MG disintegrating tablet Take 1 tablet (4 mg total) by mouth every 8 (eight) hours as needed. 09/23/20   Nita Sickle, MD  oxyCODONE-acetaminophen (PERCOCET) 5-325 MG tablet Take 1 tablet by mouth every 4 (four) hours as needed. 09/23/20   Don Perking, [place], MD  polyethylene glycol (MIRALAX / GLYCOLAX) 17 g packet Take 17 g by mouth daily as needed for mild constipation. 09/14/20   Swayze, Ava, DO    Allergies Patient has no known allergies.  Family History  Problem Relation Age of Onset  . Diabetes Mellitus II Brother     Social History Social History   Tobacco Use  . Smoking status: Current Every Day Smoker    Types: Cigarettes  . Smokeless tobacco: Current User  . 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 Alcohol use: Never  . Drug use: Never    Review of Systems  Constitutional: Negative for fever. Eyes: Negative for visual changes. ENT: Negative for sore throat. Neck: No neck pain  Cardiovascular: Negative for chest pain. Respiratory: Negative for shortness of breath. Gastrointestinal: + diffuse abdominal pain and nausea. No vomiting or diarrhea. Genitourinary: Negative for dysuria.  Musculoskeletal: Negative for back pain. Skin: Negative for rash. Neurological: Negative for headaches, weakness or numbness. Psych: No SI or HI  ____________________________________________   PHYSICAL EXAM:  VITAL SIGNS: Vitals:   09/23/20 0400 09/23/20 0415  BP: (!) 135/93   Pulse: 88 86  Resp:    Temp:    SpO2: 97% 96%    Constitutional: Alert and oriented, chronically ill appearing, in no apparent distress. HEENT:      Head:  Normocephalic and atraumatic.         Eyes: Conjunctivae are normal. Sclera is non-icteric.       Mouth/Throat: Mucous membranes are moist.       Neck: Supple with no signs of meningismus. Cardiovascular: Regular rate and rhythm. No murmurs, gallops, or rubs. 2+ symmetrical distal pulses are present in all extremities. No JVD. Respiratory: Normal respiratory effort. Lungs are clear to auscultation bilaterally. No wheezes, crackles, or rhonchi.  Gastrointestinal: Soft, nondistended, mild diffuse tenderness with no localized tenderness, rebound or guarding  Musculoskeletal: No edema, cyanosis, or erythema of extremities. Neurologic: Normal speech and language. Face is symmetric. Moving all extremities. No gross focal neurologic deficits are appreciated. Skin: Skin is warm, dry and intact. No rash noted. Psychiatric: Mood and affect are normal. Speech and behavior are normal.  ____________________________________________   LABS (all labs ordered are listed, but only abnormal results are displayed)  Labs Reviewed  COMPREHENSIVE METABOLIC PANEL - Abnormal; Notable for the following components:      Result Value   Potassium 3.4 (*)    AST 43 (*)    ALT 60 (*)    All other components within normal limits  URINALYSIS, COMPLETE (UACMP) WITH MICROSCOPIC - Abnormal; Notable for the following components:   Color, Urine YELLOW (*)    APPearance TURBID (*)    Leukocytes,Ua TRACE (*)    Bacteria, UA RARE (*)    All other components within normal limits  CBG MONITORING, ED - Abnormal; Notable for the following components:   Glucose-Capillary 181 (*)    All other components within normal limits  CBG MONITORING, ED - Abnormal; Notable for the following components:   Glucose-Capillary 161 (*)    All other components within normal limits  LIPASE, BLOOD  CBC  POC URINE PREG, ED  CBG MONITORING, ED   ____________________________________________  EKG  none   ____________________________________________  RADIOLOGY  I have personally reviewed the images performed during this visit and I agree with the Radiologist's read.   Interpretation by Radiologist:  DG Abdomen 1 View  Result Date: 09/23/2020 CLINICAL DATA:  Abdominal pain and nausea. EXAM: ABDOMEN - 1 VIEW COMPARISON:  None. FINDINGS: The bowel gas pattern is normal. Numerous punctate flecks of retained barium are seen throughout the descending and sigmoid colon. No radio-opaque calculi or other significant radiographic abnormality are seen. IMPRESSION: Negative. Electronically Signed   By: Aram Candela M.D.   On: 09/23/2020 01:49      ____________________________________________   PROCEDURES  Procedure(s) performed:yes .1-3 Lead EKG Interpretation Performed by: Nita Sickle, MD Authorized by: Nita Sickle, MD     Interpretation: non-specific     ECG rate assessment: normal     Rhythm: sinus rhythm     Ectopy: none     Critical Care performed:  None ____________________________________________   INITIAL IMPRESSION / ASSESSMENT AND PLAN / ED COURSE   24 y.o. female with a history of type 1 diabetes, chronic abdominal pain who presents for evaluation of exacerbation of her abdominal pain and  nausea.  Patient is chronically ill-appearing but in no distress with normal vital signs, afebrile, abdomen is nondistended with normal bowel sounds, mildly diffuse tender to palpation with no localized tenderness, rebound or guarding.  Patient was seen here 11 days ago for similar complaints.  Underwent a CT of the abdomen which showed no acute abnormality.  Patient reports that the pain is identical.  Ddx gastroparesis, chronic abdominal pain, constipation, SBO, UTI, pyelonephritis, GB pathology, diverticulitis, pregnancy, ectopic.  Labs reviewed by me showing no leukocytosis, no thrombocytosis, no signs of DKA with a blood glucose of 93, bicarb of 27, and anion gap of  10.  Stable mild transaminitis with normal lipase.  Negative pregnancy test.  UA with no evidence of UTI or ketones.  KUB visualized by me no acute findings, confirmed by radiology.  Will give IVF and IV haldol for possible gastroparesis.   _________________________ 4:41 AM on 09/23/2020 -----------------------------------------  Patient was transition to p.o. pain medication with pain well controlled.  She was able to tolerate p.o., had crackle his, applesauce, ginger ale with no vomiting.  Sugars remained stable.  Patient is stable for discharge home with close follow-up with PCP.  Will provide patient with a small prescription of Percocet and Zofran to have her symptoms well controlled at home so patient does not go into DKA.  Recommended close monitoring of her blood glucose.     _____________________________________________ Please note:  Patient was evaluated in Emergency Department today for the symptoms described in the history of present illness. Patient was evaluated in the context of the global COVID-19 pandemic, which necessitated consideration that the patient might be at risk for infection with the SARS-CoV-2 virus that causes COVID-19. Institutional protocols and algorithms that pertain to the evaluation of patients at risk for COVID-19 are in a state of rapid change based on information released by regulatory bodies including the CDC and federal and state organizations. These policies and algorithms were followed during the patient's care in the ED.  Some ED evaluations and interventions may be delayed as a result of limited staffing during the pandemic.   [place] Controlled Substance Database was reviewed by me. ____________________________________________   FINAL CLINICAL IMPRESSION(S) / ED DIAGNOSES   Final diagnoses:  Generalized abdominal pain      NEW MEDICATIONS STARTED DURING THIS VISIT:  ED Discharge Orders         Ordered    oxyCODONE-acetaminophen (PERCOCET)  5-325 MG tablet  Every 4 hours PRN        09/23/20 0440    ondansetron (ZOFRAN ODT) 4 MG disintegrating tablet  Every 8 hours PRN        09/23/20 0440           Note:  This document was prepared using Dragon voice recognition software and may include unintentional dictation errors.    Nita Sickle, MD 09/23/20 [phone]

## 2020-09-23 NOTE — ED Notes (Signed)
Pt provided with saltine crackers and diet gingerale for PO challenge.

## 2020-09-23 NOTE — ED Notes (Signed)
Pt able to tolerate PO intake, Don Perking MD made aware.

## 2021-05-04 IMAGING — CT CT ABD-PELV W/ CM
2 of 4 series · 16 of 46 positions shown, 18 images · IV contrast (APPLIED)
Comparison: 10/24/2015

CLINICAL DATA: Abdominal pain, unspecified. Alternating diarrhea
and constipation

EXAM:
CT ABDOMEN AND PELVIS WITH CONTRAST
TECHNIQUE: Multidetector CT imaging of the abdomen and pelvis was performed
using the standard protocol following bolus administration of
intravenous contrast.
CONTRAST:  75mL OMNIPAQUE IOHEXOL 300 MG/ML  SOLN

[Series 2: routine abd/pel with · axial · 0.65mm/px · z∈[-1062,-616]mm · 13 of 97 slices shown, 15 images]
[im 4/97  soft-tissue]
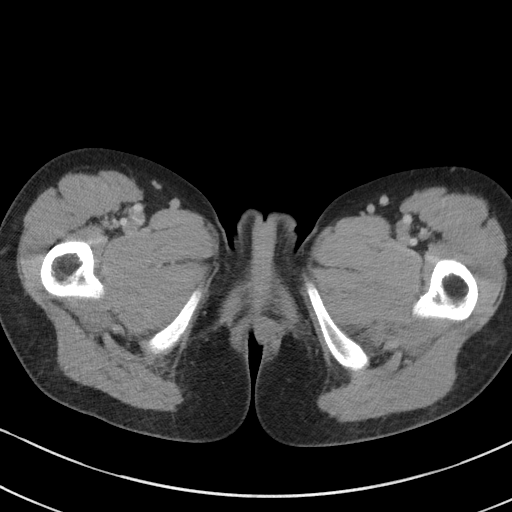
[im 4/97  bone]
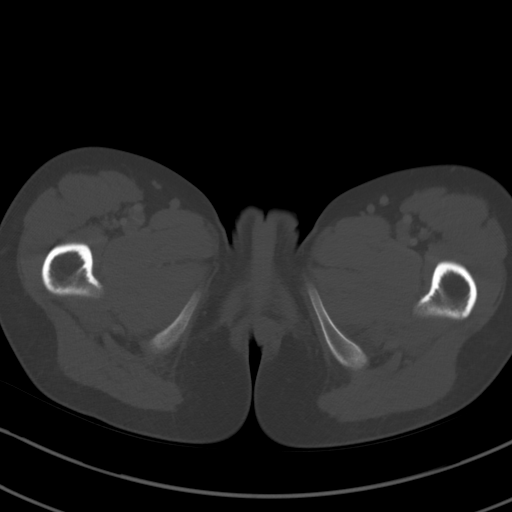
[im 12/97  soft-tissue]
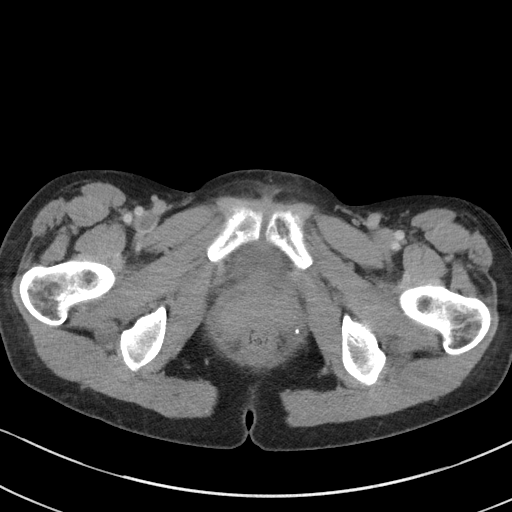
[im 20/97  soft-tissue]
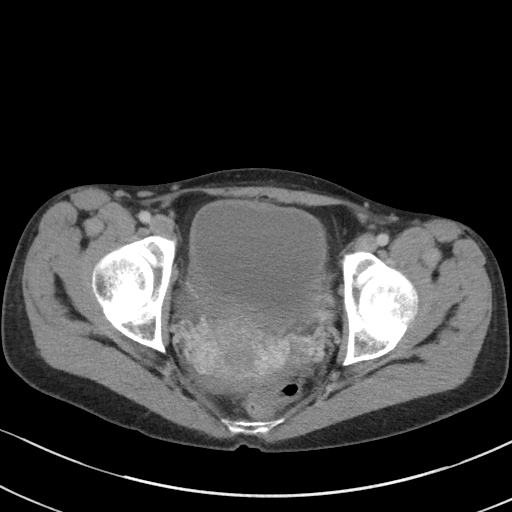
[im 27/97  soft-tissue]
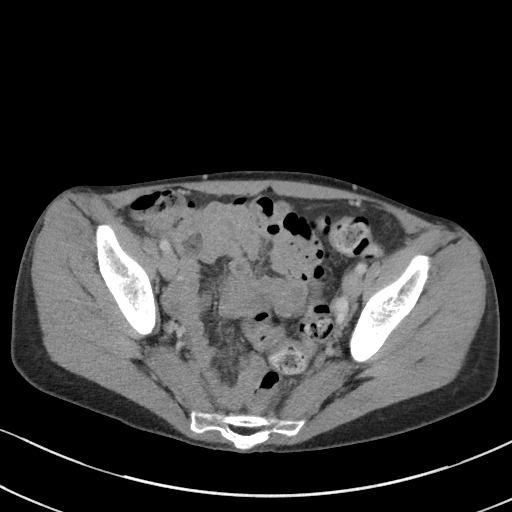
[im 35/97  soft-tissue]
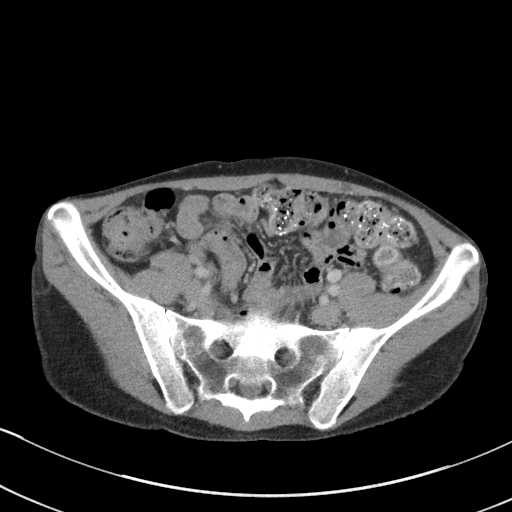
[im 43/97  soft-tissue]
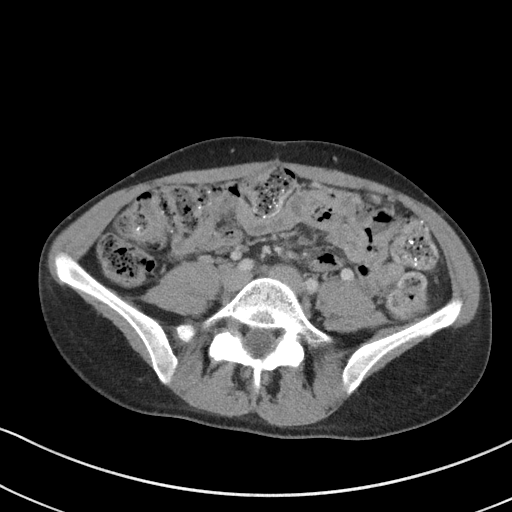
[im 50/97  soft-tissue]
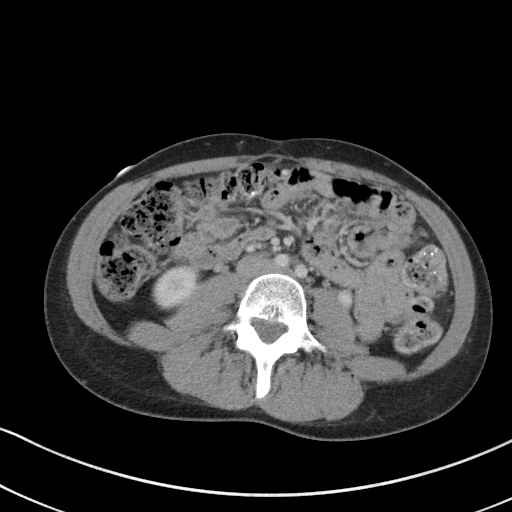
[im 54/97  soft-tissue]
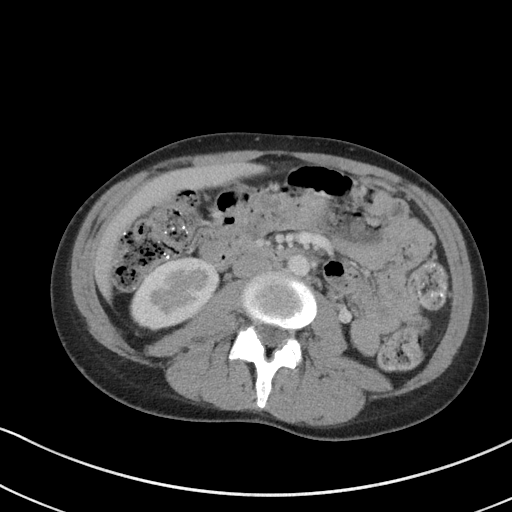
[im 62/97  soft-tissue]
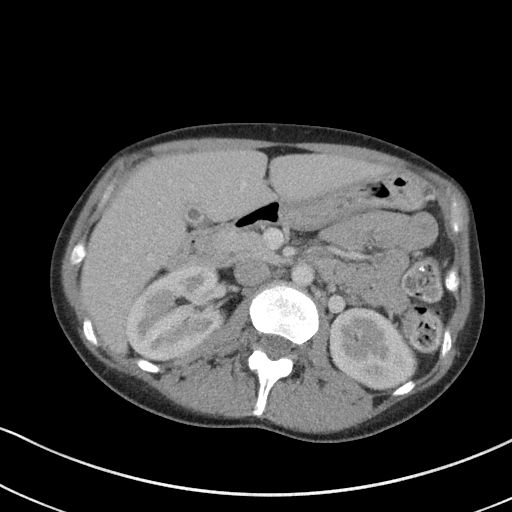
[im 62/97  bone]
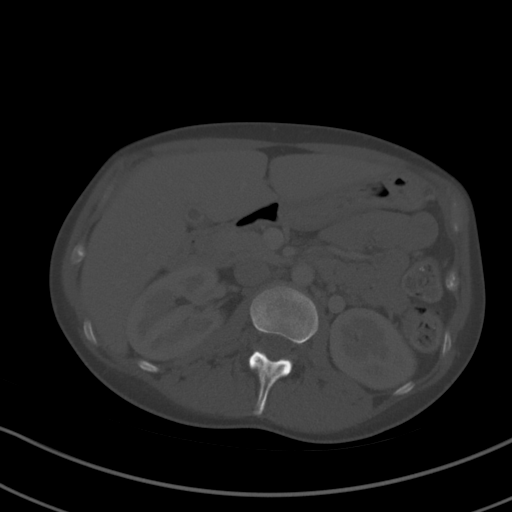
[im 70/97  soft-tissue]
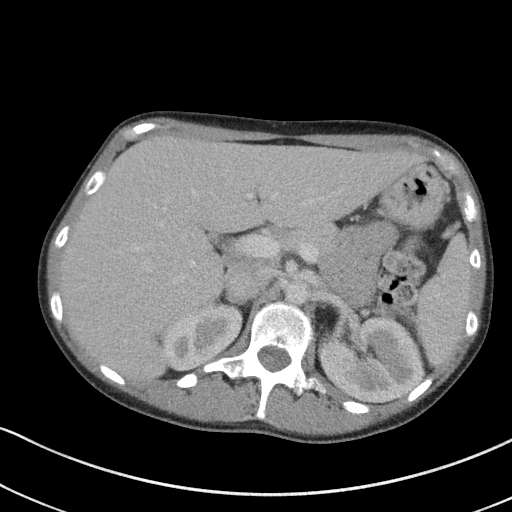
[im 77/97  soft-tissue]
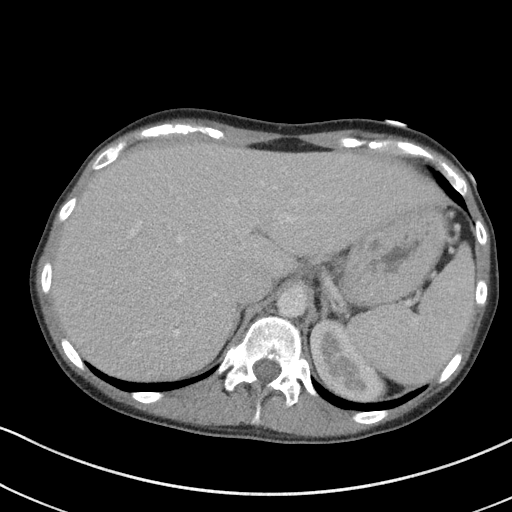
[im 85/97  soft-tissue]
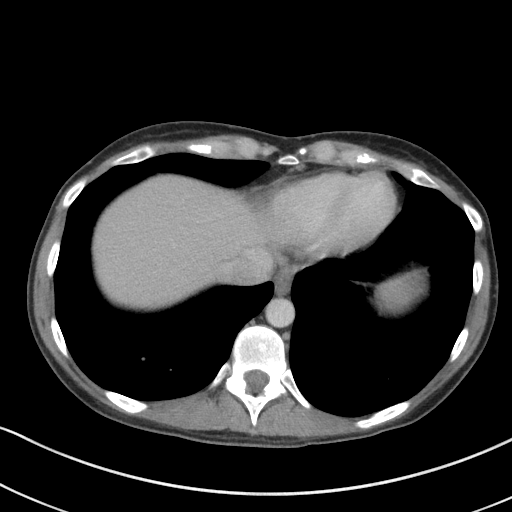
[im 93/97  soft-tissue]
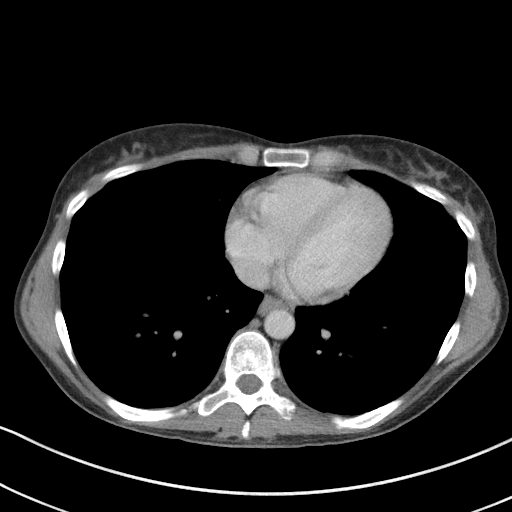

[Series 5: coronal st · coronal · 0.72mm/px · 3 of 76 slices shown]
[im 26/76  soft-tissue]
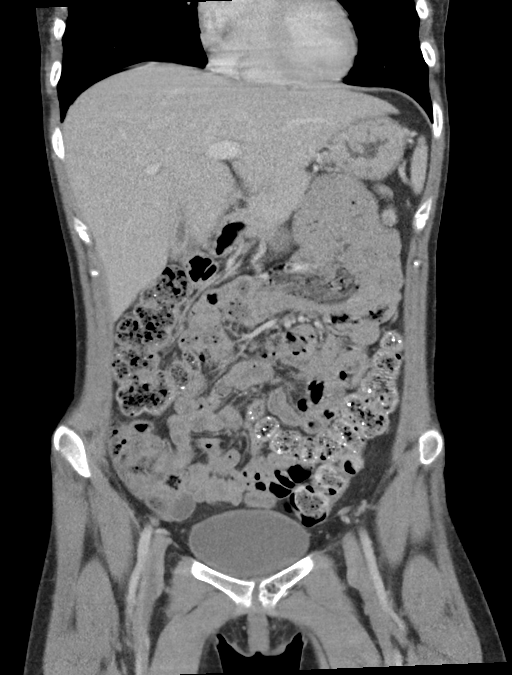
[im 34/76  soft-tissue]
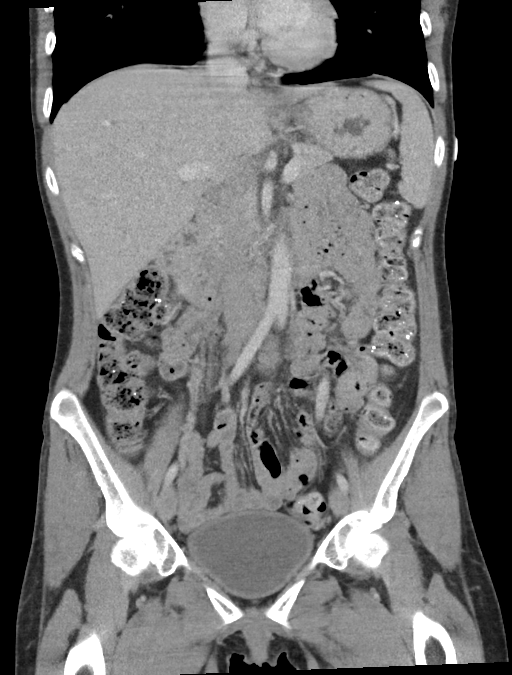
[im 42/76  soft-tissue]
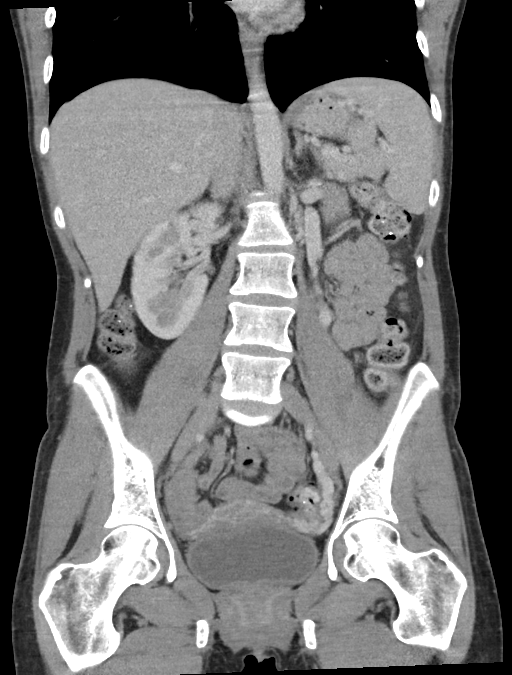

[16 of 46 positions shown; findings below may reference images not displayed]

FINDINGS: Lower chest:  No contributory findings.

Hepatobiliary: No focal liver abnormality.No evidence of biliary
obstruction or stone.

Pancreas: Unremarkable.

Spleen: Unremarkable.

Adrenals/Urinary Tract: Negative adrenals. No hydronephrosis or
stone. Unremarkable bladder.

Stomach/Bowel: No obstruction. Appendectomy. No visible bowel
inflammation. Moderate distension of the colon by desiccated stool.

Vascular/Lymphatic: No acute vascular abnormality. Heterogeneous
density of the common femoral and external iliac veins is attributed
to mixing artifact. No mass or adenopathy.

Reproductive:Prominent size parametrial veins with distended left
ovarian vein measuring up to 8 mm in diameter, incidental to the
current presentation.

Other: No ascites or pneumoperitoneum.

Musculoskeletal: No acute abnormalities.
IMPRESSION: No acute finding.  No visible bowel inflammation or obstruction.

## 2021-05-06 IMAGING — US US ABDOMEN LIMITED
1 series · 14 of 25 positions shown · non-contrast
Comparison: CT 09/12/2020

CLINICAL DATA: Elevated liver function tests.

EXAM:
ULTRASOUND ABDOMEN LIMITED RIGHT UPPER QUADRANT

[Series 1: us abdomen limited ruq (liver/gb) · 14 of 69 slices shown]
[im 1/69]
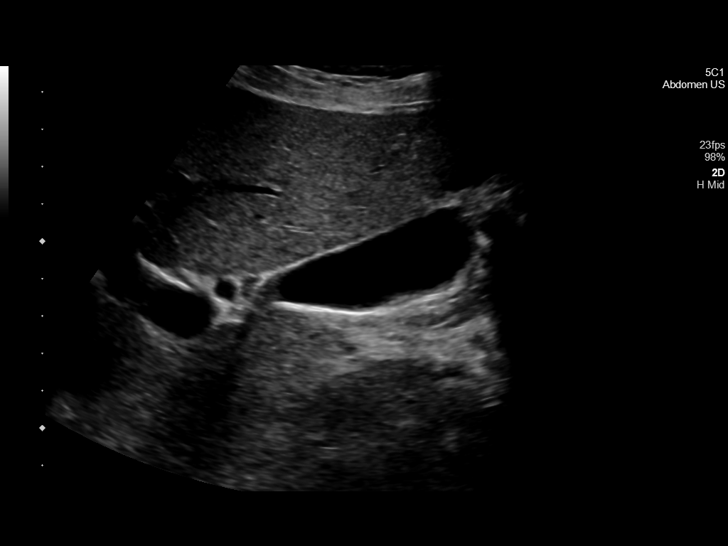
[im 6/69]
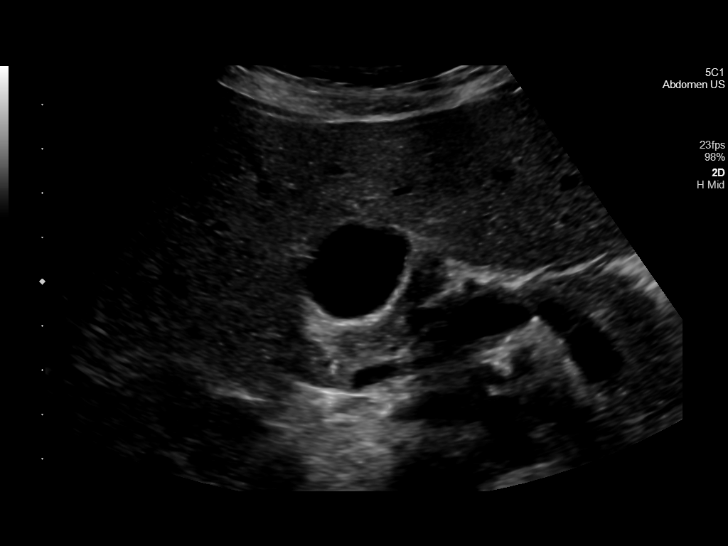
[im 12/69]
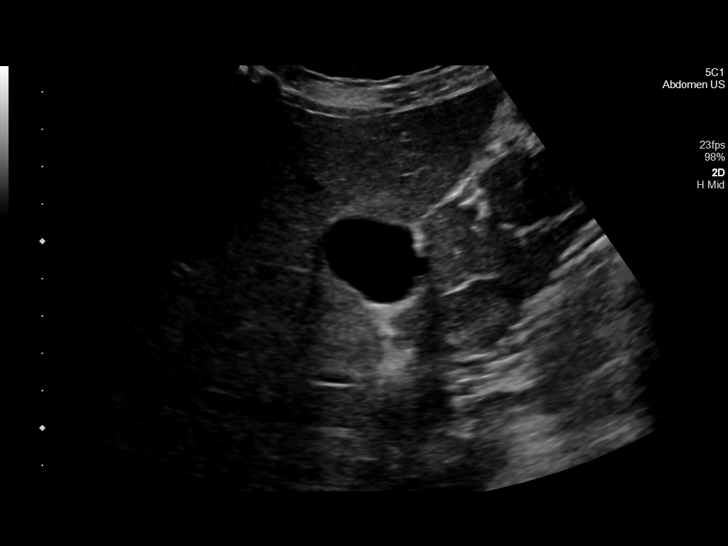
[im 18/69]
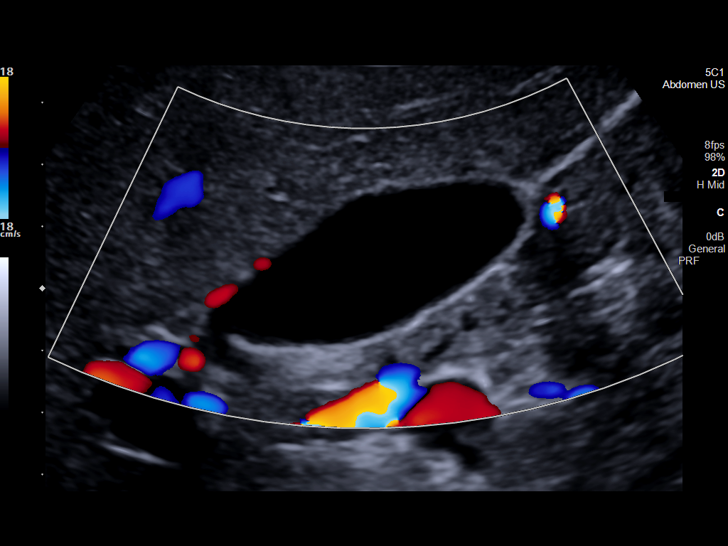
[im 23/69]
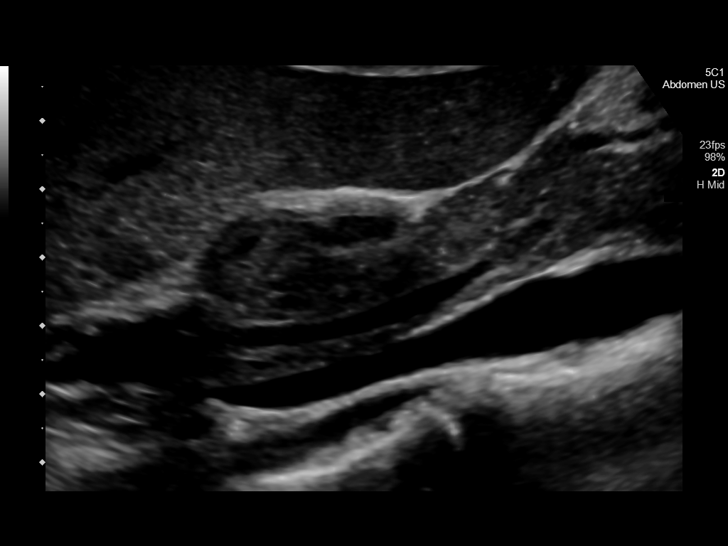
[im 26/69]
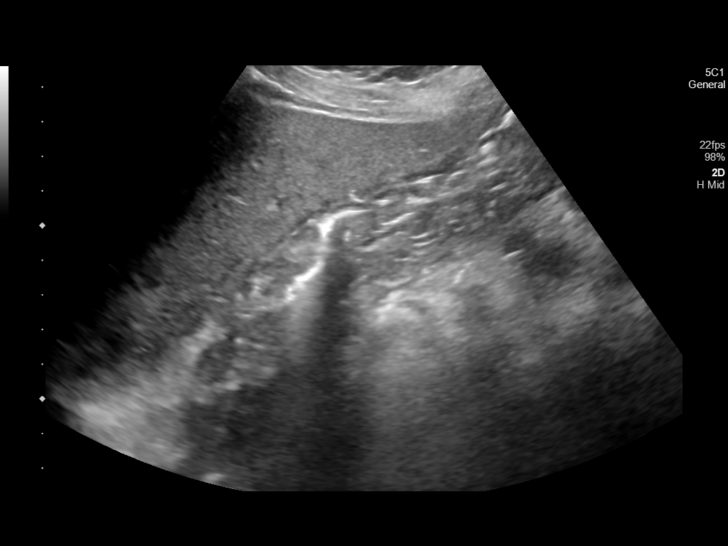
[im 32/69]
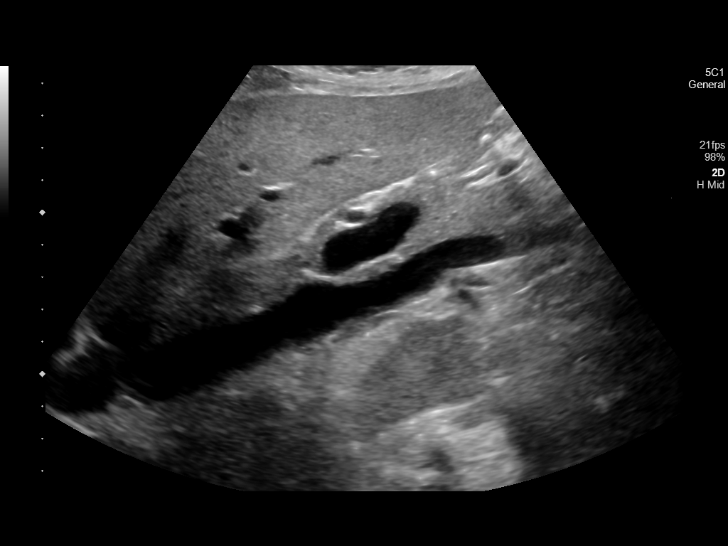
[im 37/69]
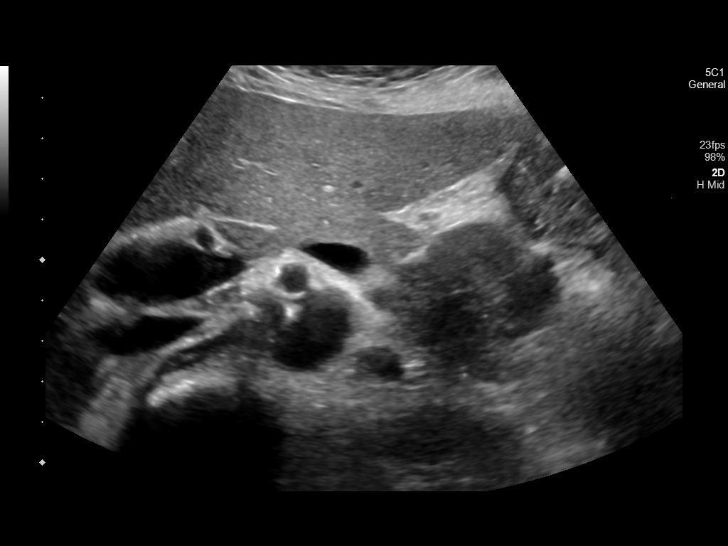
[im 43/69]
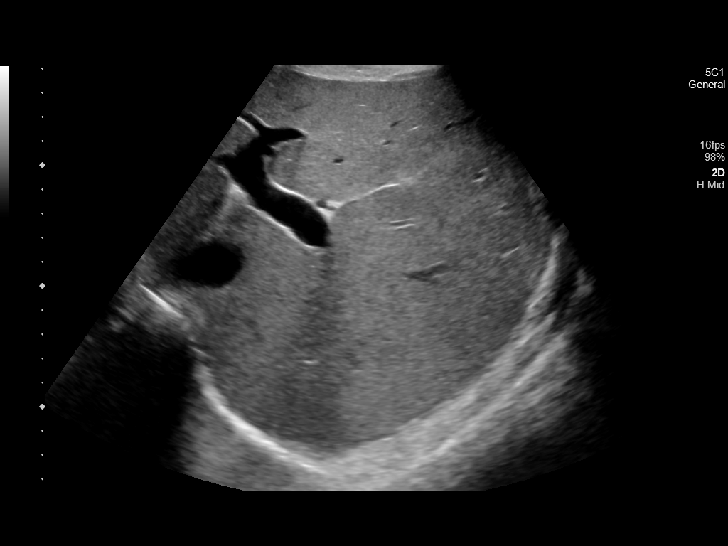
[im 46/69]
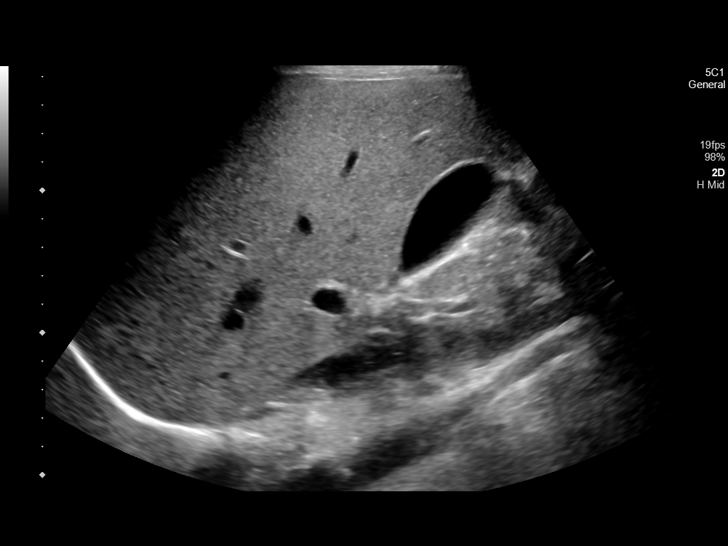
[im 52/69]
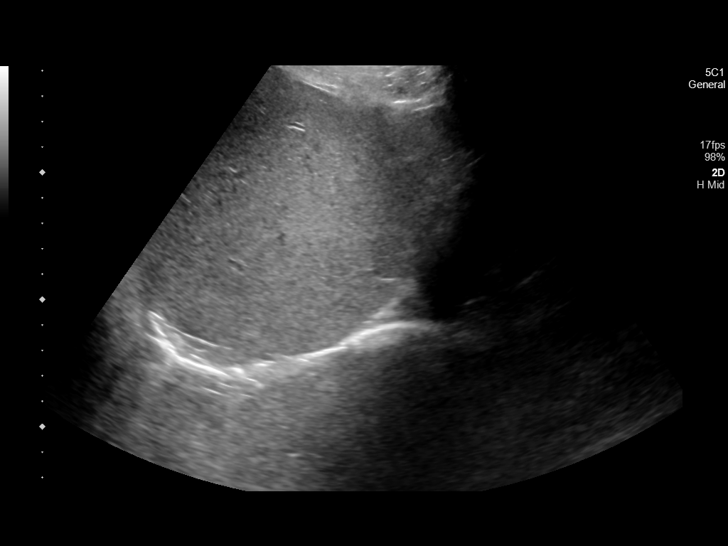
[im 57/69]
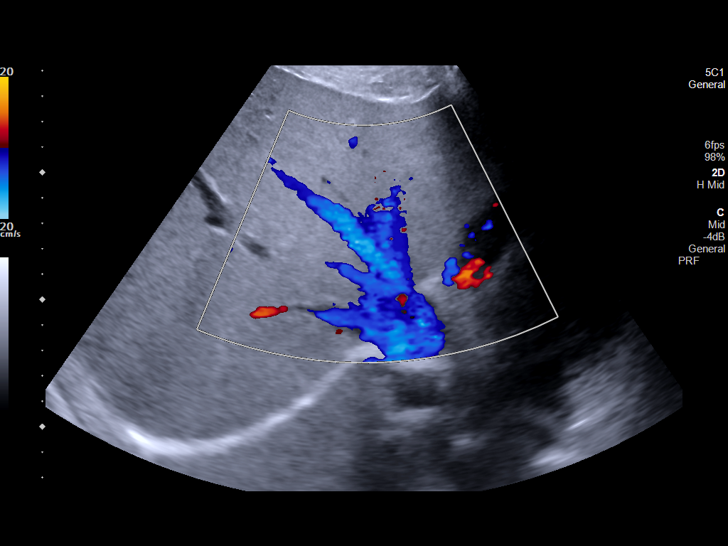
[im 63/69]
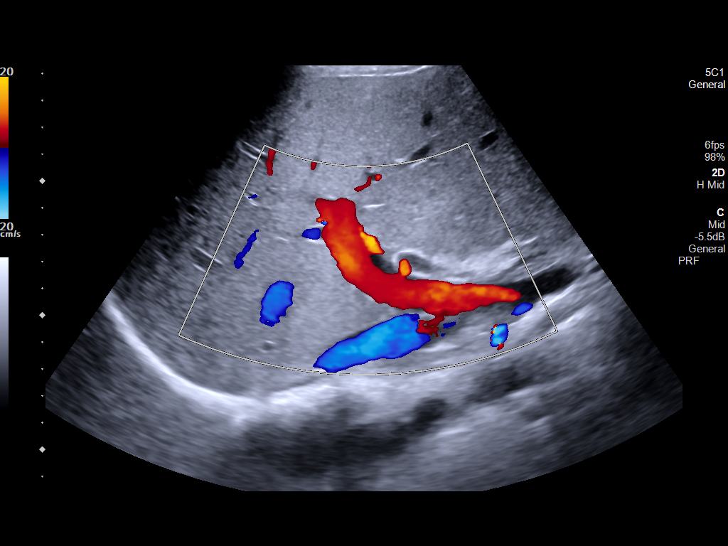
[im 69/69]
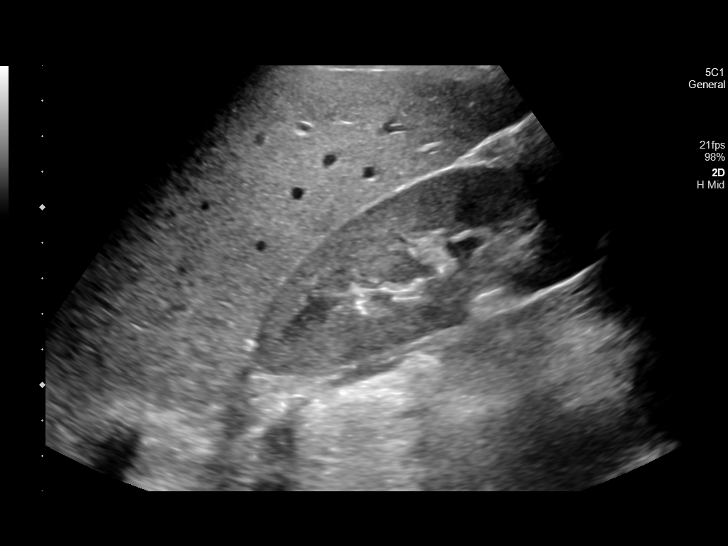

[14 of 25 positions shown; findings below may reference images not displayed]

FINDINGS: Gallbladder:

No gallstones or wall thickening visualized. No sonographic Murphy
sign noted by sonographer.

Common bile duct:

Diameter: Upper limits of normal in size.  No visible ductal stone.

Liver:

No focal lesion identified. Within normal limits in parenchymal
echogenicity. Portal vein is patent on color Doppler imaging with
normal direction of blood flow towards the liver.

Other: None.
IMPRESSION: Normal examination. No abnormality seen to explain the laboratory
abnormality. Common duct is at the upper limits of normal in size
but there are no visible gallstones or ductal stones.

## 2023-01-08 ENCOUNTER — Ambulatory Visit: Payer: Self-pay

## 2023-05-31 ENCOUNTER — Emergency Department (HOSPITAL_COMMUNITY): Payer: MEDICAID

## 2023-05-31 ENCOUNTER — Other Ambulatory Visit: Payer: Self-pay

## 2023-05-31 ENCOUNTER — Observation Stay (HOSPITAL_COMMUNITY)
Admission: EM | Admit: 2023-05-31 | Discharge: 2023-06-01 | Disposition: A | Discharge status: Home / Self Care | Payer: MEDICAID | Attending: Internal Medicine | Admitting: Internal Medicine

## 2023-05-31 ENCOUNTER — Encounter (HOSPITAL_COMMUNITY): Payer: Self-pay

## 2023-05-31 DIAGNOSIS — Z79899 Other long term (current) drug therapy: Secondary | ICD-10-CM | POA: Insufficient documentation

## 2023-05-31 DIAGNOSIS — N12 Tubulo-interstitial nephritis, not specified as acute or chronic: Principal | ICD-10-CM

## 2023-05-31 DIAGNOSIS — R109 Unspecified abdominal pain: Secondary | ICD-10-CM | POA: Diagnosis present

## 2023-05-31 DIAGNOSIS — N390 Urinary tract infection, site not specified: Secondary | ICD-10-CM | POA: Diagnosis not present

## 2023-05-31 DIAGNOSIS — E119 Type 2 diabetes mellitus without complications: Secondary | ICD-10-CM | POA: Insufficient documentation

## 2023-05-31 DIAGNOSIS — Z1152 Encounter for screening for COVID-19: Secondary | ICD-10-CM | POA: Diagnosis not present

## 2023-05-31 DIAGNOSIS — Z7982 Long term (current) use of aspirin: Secondary | ICD-10-CM | POA: Insufficient documentation

## 2023-05-31 DIAGNOSIS — N10 Acute tubulo-interstitial nephritis: Secondary | ICD-10-CM | POA: Insufficient documentation

## 2023-05-31 DIAGNOSIS — Z86718 Personal history of other venous thrombosis and embolism: Secondary | ICD-10-CM | POA: Insufficient documentation

## 2023-05-31 HISTORY — DX: Acute embolism and thrombosis of unspecified deep veins of unspecified lower extremity: I82.409

## 2023-05-31 HISTORY — DX: Hereditary deficiency of other clotting factors: D68.2

## 2023-05-31 LAB — COMPREHENSIVE METABOLIC PANEL
ALT: 29 U/L (ref 0–44)
AST: 25 U/L (ref 15–41)
Albumin: 3.8 g/dL (ref 3.5–5.0)
Alkaline Phosphatase: 62 U/L (ref 38–126)
Anion gap: 13 (ref 5–15)
BUN: 15 mg/dL (ref 6–20)
CO2: 24 mmol/L (ref 22–32)
Calcium: 8.7 mg/dL — ABNORMAL LOW (ref 8.9–10.3)
Chloride: 99 mmol/L (ref 98–111)
Creatinine, Ser: 1.05 mg/dL — ABNORMAL HIGH (ref 0.44–1.00)
GFR, Estimated: >60 mL/min (ref >60)
Glucose, Bld: 173 mg/dL — ABNORMAL HIGH (ref 70–99)
Potassium: 3.7 mmol/L (ref 3.5–5.1)
Sodium: 136 mmol/L (ref 135–145)
Total Bilirubin: 1.4 mg/dL — ABNORMAL HIGH (ref 0.3–1.2)
Total Protein: 7.7 g/dL (ref 6.5–8.1)

## 2023-05-31 LAB — I-STAT VENOUS BLOOD GAS, ED
Acid-Base Excess: 2 mmol/L (ref 0.0–2.0)
Bicarbonate: 24.2 mmol/L (ref 20.0–28.0)
Calcium, Ion: 0.98 mmol/L — ABNORMAL LOW (ref 1.15–1.40)
HCT: 40 % (ref 36.0–46.0)
Hemoglobin: 13.6 g/dL (ref 12.0–15.0)
O2 Saturation: 79 %
Potassium: 3.7 mmol/L (ref 3.5–5.1)
Sodium: 135 mmol/L (ref 135–145)
TCO2: 25 mmol/L (ref 22–32)
pCO2, Ven: 30 mmHg — ABNORMAL LOW (ref 44–60)
pH, Ven: 7.515 — ABNORMAL HIGH (ref 7.25–7.43)
pO2, Ven: 38 mmHg (ref 32–45)

## 2023-05-31 LAB — LIPASE, BLOOD: Lipase: 19 U/L (ref 11–51)

## 2023-05-31 LAB — CBC
HCT: 40.4 % (ref 36.0–46.0)
Hemoglobin: 13.1 g/dL (ref 12.0–15.0)
MCH: 29.4 pg (ref 26.0–34.0)
MCHC: 32.4 g/dL (ref 30.0–36.0)
MCV: 90.8 fL (ref 80.0–100.0)
Platelets: 248 10*3/uL (ref 150–400)
RBC: 4.45 MIL/uL (ref 3.87–5.11)
RDW: 12.6 % (ref 11.5–15.5)
WBC: 16.7 10*3/uL — ABNORMAL HIGH (ref 4.0–10.5)
nRBC: 0 % (ref 0.0–0.2)

## 2023-05-31 LAB — URINALYSIS, ROUTINE W REFLEX MICROSCOPIC
Glucose, UA: NEGATIVE mg/dL
Ketones, ur: 80 mg/dL — AB
Nitrite: POSITIVE — AB
Protein, ur: >=300 mg/dL — AB
RBC / HPF: >50 RBC/hpf (ref 0–5)
Specific Gravity, Urine: 1.028 (ref 1.005–1.030)
Trans Epithel, UA: 2
WBC, UA: >50 WBC/hpf (ref 0–5)
pH: 5 (ref 5.0–8.0)

## 2023-05-31 LAB — SARS CORONAVIRUS 2 BY RT PCR: SARS Coronavirus 2 by RT PCR: NEGATIVE

## 2023-05-31 LAB — HCG, SERUM, QUALITATIVE: Preg, Serum: NEGATIVE

## 2023-05-31 MED ORDER — ONDANSETRON 4 MG PO TBDP
ORAL_TABLET | ORAL | Status: AC
  Filled 2023-05-31: qty 1

## 2023-05-31 MED ORDER — SODIUM CHLORIDE 0.9 % IV SOLN
1.0000 g | Freq: Once | INTRAVENOUS | Status: AC
  Administered 2023-05-31: 1 g via INTRAVENOUS
  Filled 2023-05-31: qty 10

## 2023-05-31 MED ORDER — FENTANYL CITRATE PF 50 MCG/ML IJ SOSY
50.0000 ug | PREFILLED_SYRINGE | Freq: Once | INTRAMUSCULAR | Status: AC
  Administered 2023-05-31: 50 ug via INTRAVENOUS
  Filled 2023-05-31: qty 1

## 2023-05-31 MED ORDER — ONDANSETRON HCL 4 MG/2ML IJ SOLN
4.0000 mg | Freq: Once | INTRAMUSCULAR | Status: AC
  Administered 2023-05-31: 4 mg via INTRAVENOUS
  Filled 2023-05-31: qty 2

## 2023-05-31 MED ORDER — LACTATED RINGERS IV BOLUS
1000.0000 mL | Freq: Once | INTRAVENOUS | Status: AC
  Administered 2023-06-01: 1000 mL via INTRAVENOUS

## 2023-05-31 MED ORDER — ONDANSETRON 4 MG PO TBDP
4.0000 mg | ORAL_TABLET | Freq: Once | ORAL | Status: AC
  Administered 2023-05-31: 4 mg via ORAL

## 2023-05-31 NOTE — ED Triage Notes (Signed)
Pt to ED by POV from home with c/o Nausea, vomiting and R sided flank pain which began 3 days ago. Denies any urinary symptoms. Arrives A+O, VSS, NADN.

## 2023-05-31 NOTE — ED Provider Notes (Signed)
[place] EMERGENCY DEPARTMENT AT [facility] Provider Note   CSN: [id number] Arrival date & time: 05/31/23  2128     History {Add pertinent medical, surgical, social history, OB history to HPI:1} Chief Complaint  Patient presents with   Emesis   Flank Pain    Rebecca Bryant is a 27 y.o. female.  Patient with history of type 1 diabetes presenting with a 3-day history of nausea, vomiting, pain with urination, frequency, urgency.  States unable to keep anything down for the past 3 days.  8-9 episodes of vomiting daily.  Nonbilious nonbloody.  No diarrhea.  Today she developed right-sided flank pain and been constant.  Associated right-sided abdominal pain and pain in her right flank.  No vaginal bleeding or discharge.  Fever at home up to 102.  No chest pain or shortness of breath.  No cough.  Does have pain with urination, frequency and urgency.  No vaginal bleeding or discharge.  No history of kidney stones.  The history is provided by the patient.  Emesis Associated symptoms: abdominal pain and fever   Associated symptoms: no cough and no headaches   Flank Pain Associated symptoms include abdominal pain. Pertinent negatives include no chest pain, no headaches and no shortness of breath.       Home Medications Prior to Admission medications   Medication Sig Start Date End Date Taking? Authorizing Provider  Continuous Blood Gluc Receiver (DEXCOM G6 RECEIVER) DEVI Use as directed for continuous glucose monitoring. 07/18/20   [provider]  Continuous Blood Gluc Receiver (DEXCOM G6 RECEIVER) DEVI USE AS DIRECTED FOR CONTINOUS GLUCOSE MONITORING 08/03/20   [provider]  insulin aspart (NOVOLOG) 100 UNIT/ML injection Inject 3 Units into the skin 3 (three) times daily with meals. 09/14/20   Swayze, Ava, DO  LANTUS SOLOSTAR 100 UNIT/ML Solostar Pen Inject 30 Units into the skin at bedtime. 09/14/20   Swayze, Ava, DO  nicotine (NICODERM CQ - DOSED IN MG/24  HOURS) 21 mg/24hr patch Place 1 patch (21 mg total) onto the skin daily. 09/15/20   Swayze, Ava, DO  ondansetron (ZOFRAN ODT) 4 MG disintegrating tablet Take 1 tablet (4 mg total) by mouth every 8 (eight) hours as needed. 09/23/20   Nita Sickle, MD  oxyCODONE-acetaminophen (PERCOCET) 5-325 MG tablet Take 1 tablet by mouth every 4 (four) hours as needed. 09/23/20   Don Perking, [place], MD  polyethylene glycol (MIRALAX / GLYCOLAX) 17 g packet Take 17 g by mouth daily as needed for mild constipation. 09/14/20   Swayze, Ava, DO      Allergies    Patient has no known allergies.    Review of Systems   Review of Systems  Constitutional:  Positive for activity change, appetite change and fever.  HENT:  Negative for congestion and rhinorrhea.   Respiratory:  Negative for cough, chest tightness and shortness of breath.   Cardiovascular:  Negative for chest pain.  Gastrointestinal:  Positive for abdominal pain, nausea and vomiting.  Genitourinary:  Positive for dysuria, flank pain, frequency, pelvic pain and urgency.  Musculoskeletal:  Positive for back pain.  Skin:  Negative for rash.  Neurological:  Positive for weakness. Negative for dizziness and headaches.   all other systems are negative except as noted in the HPI and PMH.    Physical Exam Updated Vital Signs BP (!) 145/89 (BP Location: Right Arm)   Pulse 95   Temp 98.5 F (36.9 C) (Oral)   Resp 18   LMP 05/17/2023 (Approximate)  SpO2 96%  Physical Exam Vitals and nursing note reviewed.  Constitutional:      General: She is not in acute distress.    Appearance: She is well-developed. She is ill-appearing.  HENT:     Head: Normocephalic and atraumatic.     Mouth/Throat:     Mouth: Mucous membranes are dry.     Pharynx: No oropharyngeal exudate.  Eyes:     Conjunctiva/sclera: Conjunctivae normal.     Pupils: Pupils are equal, round, and reactive to light.  Neck:     Comments: No meningismus. Cardiovascular:     Rate and  Rhythm: Normal rate and regular rhythm.     Heart sounds: Normal heart sounds. No murmur heard. Pulmonary:     Effort: Pulmonary effort is normal. No respiratory distress.     Breath sounds: Normal breath sounds.  Abdominal:     Palpations: Abdomen is soft.     Tenderness: There is abdominal tenderness. There is no guarding or rebound.     Comments: TTP RUQ and RLQ. No guarding or rebound  Musculoskeletal:        General: Tenderness present. Normal range of motion.     Cervical back: Normal range of motion and neck supple.     Comments: R CVAT  Skin:    General: Skin is warm.  Neurological:     Mental Status: She is alert and oriented to person, place, and time.     Cranial Nerves: No cranial nerve deficit.     Motor: No abnormal muscle tone.     Coordination: Coordination normal.     Comments:  5/5 strength throughout. CN 2-12 intact.Equal grip strength.   Psychiatric:        Behavior: Behavior normal.     ED Results / Procedures / Treatments   Labs (all labs ordered are listed, but only abnormal results are displayed) Labs Reviewed  COMPREHENSIVE METABOLIC PANEL - Abnormal; Notable for the following components:      Result Value   Glucose, Bld 173 (*)    Creatinine, Ser 1.05 (*)    Calcium 8.7 (*)    Total Bilirubin 1.4 (*)    All other components within normal limits  CBC - Abnormal; Notable for the following components:   WBC 16.7 (*)    All other components within normal limits  URINALYSIS, ROUTINE W REFLEX MICROSCOPIC - Abnormal; Notable for the following components:   APPearance TURBID (*)    Hgb urine dipstick MODERATE (*)    Bilirubin Urine SMALL (*)    Ketones, ur 80 (*)    Protein, ur >=300 (*)    Nitrite POSITIVE (*)    Leukocytes,Ua MODERATE (*)    Bacteria, UA MANY (*)    All other components within normal limits  SARS CORONAVIRUS 2 BY RT PCR  CULTURE, BLOOD (ROUTINE X 2)  CULTURE, BLOOD (ROUTINE X 2)  URINE CULTURE  LIPASE, BLOOD  HCG, SERUM,  QUALITATIVE  LACTIC ACID, PLASMA  LACTIC ACID, PLASMA  BETA-HYDROXYBUTYRIC ACID  I-STAT VENOUS BLOOD GAS, ED    EKG None  Radiology No results found.  Procedures Procedures  {Document cardiac monitor, telemetry assessment procedure when appropriate:1}  Medications Ordered in ED Medications  ondansetron (ZOFRAN-ODT) 4 MG disintegrating tablet (  Canceled Entry 05/31/23 2316)  lactated ringers bolus 1,000 mL (has no administration in time range)  cefTRIAXone (ROCEPHIN) 1 g in sodium chloride 0.9 % 100 mL IVPB (has no administration in time range)  fentaNYL (SUBLIMAZE) injection 50  mcg (has no administration in time range)  ondansetron (ZOFRAN) injection 4 mg (has no administration in time range)  lactated ringers bolus 1,000 mL (has no administration in time range)  ondansetron (ZOFRAN-ODT) disintegrating tablet 4 mg (4 mg Oral Given 05/31/23 2212)    ED Course/ Medical Decision Making/ A&P   {   Click here for ABCD2, HEART and other calculatorsREFRESH Note before signing :1}                              Medical Decision Making Amount and/or Complexity of Data Reviewed Labs: ordered. Decision-making details documented in ED Course. Radiology: ordered and independent interpretation performed. Decision-making details documented in ED Course. ECG/medicine tests: ordered and independent interpretation performed. Decision-making details documented in ED Course.  Risk Prescription drug management.   Diabetic with 3 days of nausea, vomiting, right flank pain.  Does have stable vital signs on arrival.  Reports fever at home.  Concern for pyelonephritis and possible infected kidney stone.  Given IV fluids and IV antibiotics after cultures are obtained.  Urinalysis consistent with infection with large ketones.  However anion gap was normal.  {Document critical care time when appropriate:1} {Document review of labs and clinical decision tools ie heart score, Chads2Vasc2 etc:1}   {Document your independent review of radiology images, and any outside records:1} {Document your discussion with family members, caretakers, and with consultants:1} {Document social determinants of health affecting pt's care:1} {Document your decision making why or why not admission, treatments were needed:1} Final Clinical Impression(s) / ED Diagnoses Final diagnoses:  None    Rx / DC Orders ED Discharge Orders     None

## 2023-06-01 ENCOUNTER — Emergency Department (HOSPITAL_COMMUNITY): Payer: MEDICAID

## 2023-06-01 ENCOUNTER — Encounter (HOSPITAL_COMMUNITY): Payer: Self-pay | Admitting: Internal Medicine

## 2023-06-01 DIAGNOSIS — N390 Urinary tract infection, site not specified: Secondary | ICD-10-CM | POA: Diagnosis not present

## 2023-06-01 DIAGNOSIS — R112 Nausea with vomiting, unspecified: Secondary | ICD-10-CM

## 2023-06-01 DIAGNOSIS — N12 Tubulo-interstitial nephritis, not specified as acute or chronic: Secondary | ICD-10-CM

## 2023-06-01 LAB — HIV ANTIBODY (ROUTINE TESTING W REFLEX): HIV Screen 4th Generation wRfx: NONREACTIVE

## 2023-06-01 LAB — CBC WITH DIFFERENTIAL/PLATELET
Abs Immature Granulocytes: 0.06 10*3/uL (ref 0.00–0.07)
Basophils Absolute: 0 10*3/uL (ref 0.0–0.1)
Basophils Relative: 0 %
Eosinophils Absolute: 0 10*3/uL (ref 0.0–0.5)
Eosinophils Relative: 0 %
HCT: 36.5 % (ref 36.0–46.0)
Hemoglobin: 12.1 g/dL (ref 12.0–15.0)
Immature Granulocytes: 1 %
Lymphocytes Relative: 8 %
Lymphs Abs: 1 10*3/uL (ref 0.7–4.0)
MCH: 30 pg (ref 26.0–34.0)
MCHC: 33.2 g/dL (ref 30.0–36.0)
MCV: 90.6 fL (ref 80.0–100.0)
Monocytes Absolute: 1.3 10*3/uL — ABNORMAL HIGH (ref 0.1–1.0)
Monocytes Relative: 10 %
Neutro Abs: 10.8 10*3/uL — ABNORMAL HIGH (ref 1.7–7.7)
Neutrophils Relative %: 81 %
Platelets: 207 10*3/uL (ref 150–400)
RBC: 4.03 MIL/uL (ref 3.87–5.11)
RDW: 12.5 % (ref 11.5–15.5)
WBC: 13.2 10*3/uL — ABNORMAL HIGH (ref 4.0–10.5)
nRBC: 0 % (ref 0.0–0.2)

## 2023-06-01 LAB — HEMOGLOBIN A1C
Hgb A1c MFr Bld: 8 % — ABNORMAL HIGH (ref 4.8–5.6)
Mean Plasma Glucose: 182.9 mg/dL

## 2023-06-01 LAB — BASIC METABOLIC PANEL
Anion gap: 11 (ref 5–15)
BUN: 13 mg/dL (ref 6–20)
CO2: 24 mmol/L (ref 22–32)
Calcium: 8.2 mg/dL — ABNORMAL LOW (ref 8.9–10.3)
Chloride: 100 mmol/L (ref 98–111)
Creatinine, Ser: 0.86 mg/dL (ref 0.44–1.00)
GFR, Estimated: >60 mL/min (ref >60)
Glucose, Bld: 216 mg/dL — ABNORMAL HIGH (ref 70–99)
Potassium: 3.7 mmol/L (ref 3.5–5.1)
Sodium: 135 mmol/L (ref 135–145)

## 2023-06-01 LAB — BETA-HYDROXYBUTYRIC ACID: Beta-Hydroxybutyric Acid: 1.39 mmol/L — ABNORMAL HIGH (ref 0.05–0.27)

## 2023-06-01 LAB — LACTIC ACID, PLASMA
Lactic Acid, Venous: 1 mmol/L (ref 0.5–1.9)
Lactic Acid, Venous: 1.3 mmol/L (ref 0.5–1.9)

## 2023-06-01 LAB — GLUCOSE, CAPILLARY: Glucose-Capillary: 248 mg/dL — ABNORMAL HIGH (ref 70–99)

## 2023-06-01 MED ORDER — ASPIRIN 81 MG PO TBEC
81.0000 mg | DELAYED_RELEASE_TABLET | Freq: Every day | ORAL | Status: DC
  Administered 2023-06-01: 81 mg via ORAL
  Filled 2023-06-01: qty 1

## 2023-06-01 MED ORDER — ACETAMINOPHEN 500 MG PO TABS
1000.0000 mg | ORAL_TABLET | Freq: Three times a day (TID) | ORAL | Status: DC | PRN
  Administered 2023-06-01: 1000 mg via ORAL
  Filled 2023-06-01: qty 2

## 2023-06-01 MED ORDER — BUSPIRONE HCL 5 MG PO TABS: 5.0000 mg | ORAL_TABLET | Freq: Two times a day (BID) | ORAL | Status: DC | PRN

## 2023-06-01 MED ORDER — RIVAROXABAN 10 MG PO TABS
10.0000 mg | ORAL_TABLET | Freq: Every day | ORAL | Status: DC
  Administered 2023-06-01: 10 mg via ORAL
  Filled 2023-06-01: qty 1

## 2023-06-01 MED ORDER — LACTATED RINGERS IV BOLUS
1000.0000 mL | Freq: Once | INTRAVENOUS | Status: AC
  Administered 2023-06-01: 1000 mL via INTRAVENOUS

## 2023-06-01 MED ORDER — CIPROFLOXACIN HCL 500 MG PO TABS: 500.0000 mg | ORAL_TABLET | Freq: Two times a day (BID) | ORAL | 0 refills | Status: AC

## 2023-06-01 MED ORDER — INSULIN GLARGINE-YFGN 100 UNIT/ML ~~LOC~~ SOLN
5.0000 [IU] | Freq: Two times a day (BID) | SUBCUTANEOUS | Status: DC
  Administered 2023-06-01: 5 [IU] via SUBCUTANEOUS
  Filled 2023-06-01 (×6): qty 0.05

## 2023-06-01 MED ORDER — ACETAMINOPHEN 500 MG PO TABS: 1000.0000 mg | ORAL_TABLET | Freq: Three times a day (TID) | ORAL | Status: DC

## 2023-06-01 MED ORDER — INSULIN ASPART 100 UNIT/ML IJ SOLN
0.0000 [IU] | Freq: Three times a day (TID) | INTRAMUSCULAR | Status: DC
  Administered 2023-06-01: 5 [IU] via SUBCUTANEOUS

## 2023-06-01 MED ORDER — SODIUM CHLORIDE 0.9 % IV SOLN: 1.0000 g | INTRAVENOUS | Status: DC

## 2023-06-01 MED ORDER — LIDOCAINE 5 % EX PTCH
1.0000 | MEDICATED_PATCH | CUTANEOUS | Status: DC
  Administered 2023-06-01: 1 via TRANSDERMAL
  Filled 2023-06-01: qty 1

## 2023-06-01 NOTE — Plan of Care (Signed)
Patient alert/oriented X4. Patient compliant with medication administration and AVS discharge instructions explained in detail. Patient PIV removed prior to discharge and personal belongings are packed up at bedside. Patient VSS upon discharge, no complaints at this time.   Problem: Education: Goal: Ability to describe self-care measures that may prevent or decrease complications (Diabetes Survival Skills Education) will improve Outcome: Adequate for Discharge   Problem: Education: Goal: Individualized Educational Video(s) Outcome: Adequate for Discharge   Problem: Coping: Goal: Ability to adjust to condition or change in health will improve Outcome: Adequate for Discharge   Problem: Fluid Volume: Goal: Ability to maintain a balanced intake and output will improve Outcome: Adequate for Discharge   Problem: Health Behavior/Discharge Planning: Goal: Ability to identify and utilize available resources and services will improve Outcome: Adequate for Discharge   Problem: Health Behavior/Discharge Planning: Goal: Ability to manage health-related needs will improve Outcome: Adequate for Discharge   Problem: Metabolic: Goal: Ability to maintain appropriate glucose levels will improve Outcome: Adequate for Discharge   Problem: Nutritional: Goal: Maintenance of adequate nutrition will improve Outcome: Adequate for Discharge   Problem: Nutritional: Goal: Progress toward achieving an optimal weight will improve Outcome: Adequate for Discharge   Problem: Skin Integrity: Goal: Risk for impaired skin integrity will decrease Outcome: Adequate for Discharge   Problem: Tissue Perfusion: Goal: Adequacy of tissue perfusion will improve Outcome: Adequate for Discharge

## 2023-06-01 NOTE — Discharge Summary (Signed)
Name: Rebecca Bryant MRN: [id number] DOB: October 30, 1995 27 y.o. PCP: Pcp, No  Date of Admission: 05/31/2023  9:30 PM Date of Discharge: 06/01/2023 Attending Physician: Dr. Cleda Daub  Discharge Diagnosis: Principal Problem:   UTI (urinary tract infection)    Discharge Medications: Allergies as of 06/01/2023   No Known Allergies      Medication List     TAKE these medications    aspirin EC 81 MG tablet Take 81 mg by mouth daily. Swallow whole.   busPIRone 5 MG tablet Commonly known as: BUSPAR Take 5 mg by mouth 2 (two) times daily as needed (anxiety).   ciprofloxacin 500 MG tablet Commonly known as: Cipro Take 1 tablet (500 mg total) by mouth 2 (two) times daily for 7 days.   Dexcom G6 Receiver Devi Use as directed for continuous glucose monitoring.   Dexcom G6 Receiver Devi USE AS DIRECTED FOR CONTINOUS GLUCOSE MONITORING   insulin aspart 100 UNIT/ML injection Commonly known as: novoLOG Inject 3 Units into the skin 3 (three) times daily with meals.        Disposition and follow-up:   Ms.Rebecca Bryant was discharged from [facility] in [place] condition.  At the hospital follow up visit please address:  1.  Follow-up:  a.  Pyelonephritis   b.  UA had mild proteinuria.  Consider repeat.   2.  Labs / imaging needed at time of follow-up: UA,cbc,bmp   4.  Medication Changes Abx -ciprofloxacin 500 mg twice daily  End Date: 06/08/19/2024    Hospital Course by problem list:   UTI with evidence of pyelonephritis on imaging Nausea and vomiting  She  presented with fevers, dysuria, nausea, vomiting, and acute right flank pain and is admitted for UTI with possible pyelonephritis.  CT showed mild to moderate right hydronephrosis, right ureter also dilated, no evidence of stones.  She received treatment with IV antibiotics, and was transitioned to oral ciprofloxacin 500 mg for 7 days.  Urine culture results still pending. -Continue oral  ciprofloxacin 500 mg for 7 days. -Follow-up urine culture results and sensitivities.   Type 1 diabetes without DKA Patient uses an OmniPod at home . blood sugar mildly elevated here without metabolic acidosis or significant anion gap elevation.  She does have elevated beta hydroxybutyrate and ketones in her urine but she is not in DKA.  Received sliding scale insulin. -Continue on home OmniPod.  Anxiety Continued home buspirone 5 mg twice daily as needed     Discharge Subjective: Patient evaluated at bedside this AM.  Nausea and vomiting is improved, is able to tolerate oral diet.   Discharge Exam:   BP 128/64 (BP Location: Left Arm)   Pulse 94   Temp 99.5 F (37.5 C) (Oral)   Resp 18   Ht 5\' 9"  (1.753 m)   Wt 87.1 kg   LMP 05/17/2023 (Approximate)   SpO2 97%   BMI 28.36 kg/m   Constitutional: Well-appearing, pleasant, not in acute distress.   HENT: normocephalic atraumatic, mucous membranes moist Eyes: conjunctiva non-erythematous Neck: supple Cardiovascular: regular rate and rhythm, no m/r/g Pulmonary/Chest: Clear bilaterally.   Abdominal: soft, non-tender, non-distended.  Mild right CVA tenderness. MSK: normal bulk and tone Neurological: alert & oriented x 3, 5/5 strength in bilateral upper and lower extremities, normal gait Skin: Dry, no rashes. Psych: Mood and affect appropriate.  Pertinent Labs, Studies, and Procedures:     Latest Ref Rng & Units 06/01/2023    5:04 AM 05/31/2023   11:52  PM 05/31/2023    9:52 PM  CBC  WBC 4.0 - 10.5 K/uL 13.2   16.7   Hemoglobin 12.0 - 15.0 g/dL 16.1  09.6  04.5   Hematocrit 36.0 - 46.0 % 36.5  40.0  40.4   Platelets 150 - 400 K/uL 207   248        Latest Ref Rng & Units 06/01/2023    5:04 AM 05/31/2023   11:52 PM 05/31/2023    9:52 PM  CMP  Glucose 70 - 99 mg/dL 409   811   BUN 6 - 20 mg/dL 13   15   Creatinine 9.14 - 1.00 mg/dL 7.82   9.56   Sodium 213 - 145 mmol/L 135  135  136   Potassium 3.5 - 5.1 mmol/L 3.7  3.7  3.7    Chloride 98 - 111 mmol/L 100   99   CO2 22 - 32 mmol/L 24   24   Calcium 8.9 - 10.3 mg/dL 8.2   8.7   Total Protein 6.5 - 8.1 g/dL   7.7   Total Bilirubin 0.3 - 1.2 mg/dL   1.4   Alkaline Phos 38 - 126 U/L   62   AST 15 - 41 U/L   25   ALT 0 - 44 U/L   29     CT Renal Stone Study  Result Date: 06/01/2023 CLINICAL DATA:  Abdominal pain, flank pain EXAM: CT ABDOMEN AND PELVIS WITHOUT CONTRAST TECHNIQUE: Multidetector CT imaging of the abdomen and pelvis was performed following the standard protocol without IV contrast. RADIATION DOSE REDUCTION: This exam was performed according to the departmental dose-optimization program which includes automated exposure control, adjustment of the mA and/or kV according to patient size and/or use of iterative reconstruction technique. COMPARISON:  09/12/2020 FINDINGS: Lower chest: No acute abnormality Hepatobiliary: No focal hepatic abnormality. Gallbladder unremarkable. Pancreas: No focal abnormality or ductal dilatation. Spleen: No focal abnormality.  Normal size. Adrenals/Urinary Tract: Right hydronephrosis and perinephric stranding. The right ureter is dilated to the bladder. No visible obstructing stones. No stones or hydronephrosis on the left. Adrenal glands and urinary bladder unremarkable. Stomach/Bowel: Stomach, large and small bowel grossly unremarkable. Vascular/Lymphatic: No evidence of aneurysm or adenopathy. Reproductive: No adrenal abnormality. No focal renal abnormality. No stones or hydronephrosis. Urinary bladder is unremarkable. Other: No free fluid or free air. Musculoskeletal: No acute bony abnormality. IMPRESSION: Mild to moderate right hydronephrosis and perinephric stranding. The right ureter is dilated to the bladder. No visible stones. This may be related to recently passed stone. Electronically Signed   By: Charlett Nose M.D.   On: 06/01/2023 00:30     Discharge Instructions:   Signed: Laretta Bolster, MD 06/01/2023, 9:39 AM   Pager:  [phone]

## 2023-06-01 NOTE — ED Notes (Addendum)
Pt turned off personal insulin pump at this time per Geraldo Pitter DO.

## 2023-06-01 NOTE — Plan of Care (Signed)

## 2023-06-01 NOTE — H&P (Signed)
Date: 06/01/2023               Patient Name:  Rebecca Bryant MRN: [id number]  DOB: Oct 28, 1995 Age / Sex: 27 y.o., female   PCP: Pcp, No         Medical Service: Internal Medicine Teaching Service         Attending Physician: Dr. Gust Rung, DO      First Contact: Dr. Laretta Bolster, MD Pager [phone]    Second Contact: Dr. Olegario Messier, MD Pager [phone]         After Hours (After 5p/  First Contact Pager: [phone]  weekends / holidays): Second Contact Pager: [phone]   SUBJECTIVE   Chief Complaint: Right Flank Pain  History of Present Illness:  Rebecca Bryant is a 27 y/o female with PMH of T1DM, prior LE DVT, chronic LE edema, Factor V Leiden not on anticoagulation, protein S deficiency, and anxiety presenting with 3 days of fever, dysuria, nausea, and non-bloody emesis with 1 day of right sided flank pain.  Symptoms began with fever and malaise followed by severe nausea and vomiting and were accompanied with dysuria and suprapubic pain.  She took a couple days of Azo at home with relief of the suprapubic pain and dysuria but for the past day she has had moderate to severe right sided flank pain with continued fevers up to 102 F with continued nausea and vomiting.  She does have a new sexual partner but denies any vaginal discharge, itching, or other signs of symptoms of STI.  She has had a urinary tract infection in the past but has not had flank pain like this.  She denies any recent changes in her medications.  She was last able to eat about 3 days ago and continues to wear her OmniPod and Dexcom.  Her sugars have been relatively elevated over the past 24 hours up to the 200s.  She last had DKA over 2 years ago.  ED Course: Presented mildly hypertensive but otherwise hemodynamically stable and afebrile.  Found to have leukocytosis at 17, glucose of 173, anion gap of 13, bicarb of 24, beta hydroxybutyrate 1.39, and creatinine of 1.05.  Urinalysis consistent with urinary tract  infection although there were some squamous cells present.  There is significant proteinuria, ketonuria, and hematuria.  CT stone protocol showed right hydronephrosis and perinephric stranding as well as a dilated right ureter without any visible stones.  Patient was given 3 L of LR, Zofran, fentanyl, and ceftriaxone in the ED.  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  Prior history of IV morphine and methamphetamine use, noted the past 3 years.    Family History:  Thyroid disease in mother and maternal grandmother Bipolar disorder, alcohol use disorder, and depression in father  Allergies: Allergies as of 05/31/2023   (No Known Allergies)    Review of Systems: A complete ROS was negative except as per HPI.   OBJECTIVE:   Physical Exam: Blood pressure 136/70, pulse 79, temperature 98.5 F (36.9 C), temperature source Oral, resp. rate 16, last menstrual period 05/17/2023, SpO2 100%.  Constitutional: Well-appearing  adult female laying in bed. In no acute distress. HENT: Normocephalic, atraumatic,  Eyes: Sclera non-icteric, PERRL, EOM intact Cardio:Regular rate and rhythm. No murmurs, rubs, or gallops. 2+ bilateral radial and dorsalis pedis  pulses. Pulm:Clear to auscultation bilaterally. Normal work of breathing on room air. Abdomen: Soft, non-distended, positive bowel sounds.  No CVA tenderness but mild suprapubic tenderness. MSK: Trace lower extremity edema Skin:Warm and dry.  Healing sclerotherapy wounds on the right lower extremity above the medial  malleolus. Neuro:Alert and oriented x3. No focal deficit noted. Psych:Pleasant mood and affect.  Labs: CBC    Component Value Date/Time   WBC 16.7 (H) 05/31/2023 2152   RBC 4.45 05/31/2023 2152   HGB 13.6 05/31/2023 2352   HCT 40.0 05/31/2023 2352   PLT 248 05/31/2023 2152   MCV 90.8 05/31/2023 2152   MCH 29.4 05/31/2023 2152   MCHC 32.4 05/31/2023 2152   RDW 12.6 05/31/2023 2152   LYMPHSABS 1.9 09/14/2020 0437   MONOABS 0.5 09/14/2020 0437   EOSABS 0.2 09/14/2020 0437   BASOSABS 0.1 09/14/2020 0437     CMP     Component Value Date/Time   NA 135 05/31/2023 2352   K 3.7 05/31/2023 2352   CL 99 05/31/2023 2152   CO2 24 05/31/2023 2152   GLUCOSE 173 (H) 05/31/2023 2152   BUN 15 05/31/2023 2152   CREATININE 1.05 (H) 05/31/2023 2152   CALCIUM 8.7 (L) 05/31/2023 2152   PROT 7.7 05/31/2023 2152   ALBUMIN 3.8 05/31/2023 2152   AST 25 05/31/2023 2152   ALT 29 05/31/2023 2152   ALKPHOS 62 05/31/2023 2152   BILITOT 1.4 (H) 05/31/2023 2152   GFRNONAA >60 05/31/2023 2152    Imaging: CT Renal Stone Study Result Date: 06/01/2023 IMPRESSION: Mild to moderate right hydronephrosis and perinephric stranding. The right ureter is dilated to the bladder. No visible stones. This may be related to recently passed stone.  Electronically Signed   By: Charlett Nose M.D.   On: 06/01/2023 00:30     ASSESSMENT & PLAN:   Assessment & Plan by Problem: Principal Problem:   UTI (urinary tract infection)   Rebecca Bryant is a 27 y.o. female with pertinent PMH of T1DM, Factor V Leiden with prior LE DVT, and anxiety who presented with fevers, dysuria, nausea, vomiting, and acute right flank pain and is admitted for UTI with possible pyelonephritis.  UTI with evidence of pyelonephritis on imaging Nausea and vomiting Symptoms, imaging, and UA, although with squamous cells, consistent with urinary tract infection with mild pyelonephritis.  Overall she appears well with good control of her nausea  and pain.  Would be possible to treat her as an outpatient but due to her severe nausea and vomiting prior to medications and type 1 diabetes she was admitted for adequate fluid rehydration and we will continue IV antibiotics for now. - Follow urine cultures - Continue ceftriaxone 1 g daily -Tylenol 1000 mg 3 times daily.  We can add oxycodone 5 mg every 6 hours as needed if her pain is uncontrolled - Pending admission EKG, will add Zofran 4 mg IV every 8 hours as needed  Type 1 diabetes without DKA Patient uses an OmniPod at home and loads 150 units about every 3 days but does not usually run out when she changes it.  Blood sugar mildly elevated here without metabolic acidosis or significant anion gap elevation.  She does have elevated beta hydroxybutyrate and ketones in her urine but she is not in DKA.  She is feeling well enough to eat. - Hold OmniPod during admission - Semglee 5 units twice daily and moderate sliding scale insulin  UA abnormalities Proteinuria, hematuria Significant proteinuria over 300 on dip and 50+ red blood cells under the microscope.  There is also some white blood cell clumps seen on the microscope which along with hematuria could be due to for pyelonephritis and possibly a previously passed renal stone.  Proteinuria also be from her pyelonephritis with or without nephrolithiasis but it does appear that she may have had proteinuria on her recent urinalysis.  She does have longstanding type 1 diabetes.  No significant renal dysfunction at this time.  I would favor repeating urinalysis after treatment of acute infection.  Low suspicion for glomerular nephritis or nephrotic syndrome at this time. - Continue to monitor renal function and repeat UA in the future  Anxiety Continue home buspirone 5 mg twice daily as needed  Diet: Carb-Modified VTE: DOAC Code: Full  Dispo: Admit patient to Observation with expected length of stay less than 2 midnights.  Signed: Rocky Morel, DO Internal Medicine Resident PGY-2  06/01/2023, 3:19 AM   Dr. Laretta Bolster, MD Pager [phone]

## 2023-06-01 NOTE — Hospital Course (Addendum)
UTI with evidence of pyelonephritis on imaging Nausea and vomiting  She  presented with fevers, dysuria, nausea, vomiting, and acute right flank pain and is admitted for UTI with possible pyelonephritis.  CT showed mild to moderate right hydronephrosis, right ureter also dilated, no evidence of stones.  She received treatment with IV antibiotics, and was transitioned to oral ciprofloxacin 500 mg for 7 days.  Urine culture results still pending. -Continue oral ciprofloxacin 500 mg for 7 days. -Follow-up urine culture results and sensitivities.   Type 1 diabetes without DKA Patient uses an OmniPod at home . blood sugar mildly elevated here without metabolic acidosis or significant anion gap elevation.  She does have elevated beta hydroxybutyrate and ketones in her urine but she is not in DKA.  Received sliding scale insulin. -Continue on home OmniPod.  Anxiety Continued home buspirone 5 mg twice daily as needed

## 2023-06-01 NOTE — Discharge Instructions (Addendum)
It was a pleasure taking care of you. You were admitted due to having right abdominal pain, and UTI.  - Please continue ciprofloxacin 500 mg twice a day for 7 days.   -Please report to the hospital if you have worsening flank pain or abdominal pain.  -Follow-up with [facility]. Someone from the office will call you and schedule that ointment.

## 2023-06-03 LAB — URINE CULTURE: Culture: >=100000 — AB

## 2023-06-06 LAB — CULTURE, BLOOD (ROUTINE X 2)
Culture: NO GROWTH
Culture: NO GROWTH
Special Requests: ADEQUATE
Special Requests: ADEQUATE

## 2023-06-18 ENCOUNTER — Encounter: Payer: MEDICAID | Admitting: Student

## 2023-08-05 ENCOUNTER — Encounter (HOSPITAL_COMMUNITY): Payer: Self-pay | Admitting: Emergency Medicine

## 2023-08-05 ENCOUNTER — Emergency Department (HOSPITAL_COMMUNITY)
Admission: EM | Admit: 2023-08-05 | Discharge: 2023-08-05 | Disposition: A | Discharge status: Home / Self Care | Payer: MEDICAID | Attending: Emergency Medicine | Admitting: Emergency Medicine

## 2023-08-05 ENCOUNTER — Other Ambulatory Visit: Payer: Self-pay

## 2023-08-05 DIAGNOSIS — R112 Nausea with vomiting, unspecified: Secondary | ICD-10-CM | POA: Diagnosis present

## 2023-08-05 DIAGNOSIS — E876 Hypokalemia: Secondary | ICD-10-CM | POA: Insufficient documentation

## 2023-08-05 LAB — COMPREHENSIVE METABOLIC PANEL
ALT: 30 U/L (ref 0–44)
AST: 19 U/L (ref 15–41)
Albumin: 4 g/dL (ref 3.5–5.0)
Alkaline Phosphatase: 56 U/L (ref 38–126)
Anion gap: 14 (ref 5–15)
BUN: 16 mg/dL (ref 6–20)
CO2: 23 mmol/L (ref 22–32)
Calcium: 8.8 mg/dL — ABNORMAL LOW (ref 8.9–10.3)
Chloride: 99 mmol/L (ref 98–111)
Creatinine, Ser: 0.9 mg/dL (ref 0.44–1.00)
GFR, Estimated: >60 mL/min (ref >60)
Glucose, Bld: 123 mg/dL — ABNORMAL HIGH (ref 70–99)
Potassium: 3 mmol/L — ABNORMAL LOW (ref 3.5–5.1)
Sodium: 136 mmol/L (ref 135–145)
Total Bilirubin: 1.3 mg/dL — ABNORMAL HIGH (ref 0.3–1.2)
Total Protein: 8.1 g/dL (ref 6.5–8.1)

## 2023-08-05 LAB — URINALYSIS, ROUTINE W REFLEX MICROSCOPIC
Bilirubin Urine: NEGATIVE
Glucose, UA: NEGATIVE mg/dL
Ketones, ur: 20 mg/dL — AB
Nitrite: POSITIVE — AB
Protein, ur: 100 mg/dL — AB
RBC / HPF: >50 RBC/hpf (ref 0–5)
Specific Gravity, Urine: 1.016 (ref 1.005–1.030)
WBC, UA: >50 WBC/hpf (ref 0–5)
pH: 6 (ref 5.0–8.0)

## 2023-08-05 LAB — CBC
HCT: 42 % (ref 36.0–46.0)
Hemoglobin: 13.9 g/dL (ref 12.0–15.0)
MCH: 30.4 pg (ref 26.0–34.0)
MCHC: 33.1 g/dL (ref 30.0–36.0)
MCV: 91.9 fL (ref 80.0–100.0)
Platelets: 241 10*3/uL (ref 150–400)
RBC: 4.57 MIL/uL (ref 3.87–5.11)
RDW: 13.2 % (ref 11.5–15.5)
WBC: 15.1 10*3/uL — ABNORMAL HIGH (ref 4.0–10.5)
nRBC: 0 % (ref 0.0–0.2)

## 2023-08-05 LAB — HCG, SERUM, QUALITATIVE: Preg, Serum: NEGATIVE

## 2023-08-05 LAB — CBG MONITORING, ED: Glucose-Capillary: 124 mg/dL — ABNORMAL HIGH (ref 70–99)

## 2023-08-05 LAB — LIPASE, BLOOD: Lipase: 22 U/L (ref 11–51)

## 2023-08-05 MED ORDER — ONDANSETRON HCL 4 MG PO TABS: 4.0000 mg | ORAL_TABLET | Freq: Four times a day (QID) | ORAL | 0 refills | Status: DC

## 2023-08-05 MED ORDER — SODIUM CHLORIDE 0.9 % IV SOLN
1.0000 g | Freq: Once | INTRAVENOUS | Status: AC
  Administered 2023-08-05: 1 g via INTRAVENOUS
  Filled 2023-08-05: qty 10

## 2023-08-05 MED ORDER — SULFAMETHOXAZOLE-TRIMETHOPRIM 800-160 MG PO TABS: 1.0000 | ORAL_TABLET | Freq: Two times a day (BID) | ORAL | 0 refills | Status: AC

## 2023-08-05 MED ORDER — SULFAMETHOXAZOLE-TRIMETHOPRIM 800-160 MG PO TABS
1.0000 | ORAL_TABLET | Freq: Once | ORAL | Status: AC
  Administered 2023-08-05: 1 via ORAL
  Filled 2023-08-05: qty 1

## 2023-08-05 MED ORDER — SULFAMETHOXAZOLE-TRIMETHOPRIM 800-160 MG PO TABS: 1.0000 | ORAL_TABLET | Freq: Two times a day (BID) | ORAL | 0 refills | Status: DC

## 2023-08-05 MED ORDER — METOCLOPRAMIDE HCL 5 MG/ML IJ SOLN
10.0000 mg | Freq: Once | INTRAMUSCULAR | Status: AC
  Administered 2023-08-05: 10 mg via INTRAVENOUS
  Filled 2023-08-05: qty 2

## 2023-08-05 NOTE — Discharge Instructions (Signed)
Evaluation today revealed that you likely do have a UTI, given your intermittent abdominal discomfort I am going to go ahead and treat you for pyelonephritis which is a kidney infection secondary to UTI.  Please take the Bactrim which is antibiotic as prescribed.  Please take the entire course even if you are feeling better.  Also recommend you to follow-up with urology for recurrence of her UTIs.  If you develop flank pain, fever, persistent nausea vomiting diarrhea, abdominal pain or any other concerning symptom please return emergency department further evaluation.

## 2023-08-05 NOTE — ED Provider Notes (Signed)
[facility] EMERGENCY DEPARTMENT AT [facility] Provider Note   CSN: [id number] Arrival date & time: 08/05/23  0444     History  Chief Complaint  Patient presents with   Abdominal Pain   Emesis    Rebecca Bryant is a 27 y.o. female.  27 year old female presents with concern for abdominal discomfort and vomiting with fevers and dysuria.  States her symptoms started about 2 weeks ago with a fever of 10 1-1 02 and vomiting, felt maybe she had a viral illness.  Patient went to urgent care, was provided with Phenergan and Zofran 2 days ago, told that she had a bad UTI and given antibiotics but told not to take it until she keep food in her stomach.  States that she has been unable to keep anything down.  Reports kidney stone in the past month or 2 which she passed spontaneously did not require intervention, since that time has felt she has had dysuria off and on. History of diabetes, factor V deficiency, DVT.       Home Medications Prior to Admission medications   Medication Sig Start Date End Date Taking? Authorizing Provider  aspirin EC 81 MG tablet Take 81 mg by mouth daily. Swallow whole.    [provider]  busPIRone (BUSPAR) 5 MG tablet Take 5 mg by mouth 2 (two) times daily as needed (anxiety).    [provider]  Continuous Blood Gluc Receiver (DEXCOM G6 RECEIVER) DEVI Use as directed for continuous glucose monitoring. 07/18/20   [provider]  Continuous Blood Gluc Receiver (DEXCOM G6 RECEIVER) DEVI USE AS DIRECTED FOR CONTINOUS GLUCOSE MONITORING 08/03/20   [provider]  insulin aspart (NOVOLOG) 100 UNIT/ML injection Inject 3 Units into the skin 3 (three) times daily with meals. 09/14/20   Swayze, Ava, DO      Allergies    Patient has no known allergies.    Review of Systems   Review of Systems Negative except as per HPI Physical Exam Updated Vital Signs BP (!) 148/82 (BP Location: Left Arm)   Pulse 91   Temp 100 F  (37.8 C) (Oral)   Resp 18   Ht 5\' 9"  (1.753 m)   Wt 83.9 kg   LMP 06/17/2023 (Approximate)   SpO2 98%   BMI 27.32 kg/m  Physical Exam Vitals and nursing note reviewed.  Constitutional:      General: She is not in acute distress.    Appearance: She is well-developed. She is not diaphoretic.  HENT:     Head: Normocephalic and atraumatic.  Cardiovascular:     Rate and Rhythm: Normal rate and regular rhythm.     Heart sounds: Normal heart sounds.  Pulmonary:     Effort: Pulmonary effort is normal.     Breath sounds: Normal breath sounds.  Abdominal:     Palpations: Abdomen is soft.     Tenderness: There is no abdominal tenderness. There is no right CVA tenderness or left CVA tenderness.  Skin:    General: Skin is warm and dry.     Findings: No erythema or rash.  Neurological:     Mental Status: She is alert and oriented to person, place, and time.  Psychiatric:        Behavior: Behavior normal.     ED Results / Procedures / Treatments   Labs (all labs ordered are listed, but only abnormal results are displayed) Labs Reviewed  CBG MONITORING, ED - Abnormal; Notable for the following components:  Result Value   Glucose-Capillary 124 (*)    All other components within normal limits  URINALYSIS, ROUTINE W REFLEX MICROSCOPIC  LIPASE, BLOOD  COMPREHENSIVE METABOLIC PANEL  CBC  HCG, SERUM, QUALITATIVE    EKG None  Radiology No results found.  Procedures Procedures    Medications Ordered in ED Medications  metoCLOPramide (REGLAN) injection 10 mg (has no administration in time range)    ED Course/ Medical Decision Making/ A&P                                 Medical Decision Making Amount and/or Complexity of Data Reviewed Labs: ordered.   This patient presents to the ED for concern of vomiting, this involves an extensive number of treatment options, and is a complaint that carries with it a high risk of complications and morbidity.  The differential  diagnosis includes but not limited to UTI, metabolic or electrolyte derangement, gastritis   Co morbidities that complicate the patient evaluation  As reviewed above in HPI   Additional history obtained:  External records from outside source obtained and reviewed including visit to [facility] urgent care dated 07/22/2023, urinalysis negative for leukocytes and nitrates, STD panel negative. Unable to identify any records from visit reported 2 days ago.   Lab Tests:  I Ordered, and personally interpreted labs.  The pertinent results include: CBG 124   Problem List / ED Course / Critical interventions / Medication management  27 year old female with complaint of fevers, chills, vomiting, intermittent dysuria and abdominal discomfort.  Abdomen soft nontender, no CVA tenderness.  Plan is to obtain labs, provide antiemetics.  IV fluids if warranted otherwise explained IV fluid shortage patient and will provide p.o. fluids if tolerated. I ordered medication including Reglan  for nausea, vomiting  I have reviewed the patients home medicines and have made adjustments as needed   Social Determinants of Health:  PCP not on file   Test / Admission - Considered:  Disposition pending at time of signout to oncoming provider         Final Clinical Impression(s) / ED Diagnoses Final diagnoses:  Nausea and vomiting, unspecified vomiting type    Rx / DC Orders ED Discharge Orders     None         Jeannie Fend, PA-C 08/05/23 0556    Nira Conn, MD 08/05/23 [phone]

## 2023-08-05 NOTE — ED Provider Notes (Signed)
Accepted handoff at shift change from [employer] Melia PA-C. Please see prior provider note for more detail.   Briefly: Patient is 27 y.o. presenting for abdominal discomfort, vomiting, fevers, and dysuria started 2 weeks ago.   DDX: concern for UTI, metabolic or electrolyte derangement, gastritis   Plan: Follow-up on urinalysis.  Likely will need treatment for UTI.  Likely discharge after treatment.    Physical Exam  BP (!) 148/82 (BP Location: Left Arm)   Pulse 91   Temp 100 F (37.8 C) (Oral)   Resp 18   Ht 5\' 9"  (1.753 m)   Wt 83.9 kg   LMP 06/17/2023 (Approximate)   SpO2 98%   BMI 27.32 kg/m   Physical Exam  Procedures  Procedures  ED Course / MDM   Clinical Course as of 08/05/23 0813  Tue Aug 05, 2023  0632 Two weeks of fevers, vomiting and urinary symptoms. Negative STD panel. August had a kidney stone. Non tender abdomen. Plan: UA pending, oral hydration, treat for UTI. [JR]    Clinical Course User Index [JR] Gareth Eagle, PA-C   Medical Decision Making Amount and/or Complexity of Data Reviewed Labs: ordered.  Risk Prescription drug management.   Urinalysis was convincing for UTI.  Given that she has had intermittent abdominal discomfort, leukocytosis and UA concerning for UTI, thought it was reasonable to treat her for pyelonephritis.  Treated with 1 dose of IV Rocephin and started her on Bactrim.  Patient remains well appearing, no acute distress and hemodynamically stable.  Given the recurrence of her UTIs, provide follow-up with urology.  Vital stable.  Discharged.        Gareth Eagle, PA-C 08/05/23 1610    Derwood Kaplan, MD 08/06/23 Zollie Pee

## 2023-08-05 NOTE — ED Triage Notes (Signed)
  Patient comes in with abdominal pain and emesis that has been going on for about a week.  Patient states she was seen and treated for UTI yesterday.  Given antibiotics and phenergan.  Patient states she is unable to keep anything down and feels dehydrated.  Was admitted in August for pyelonephritis.  Pain 4/10, cramping.

## 2024-01-14 NOTE — Progress Notes (Signed)
 This Document was created using the aid of voice recognition Dragon dictation software.  HPI: Diabetes type 1: Patient's blood sugar control is improving.   Patient is currently on OmniPod 5 insulin  pump with Dexcom G6 CGM.  Patient reports that she had some problems with getting the G6 refills. Patient is using Dexcom CGM. I have reviewed and interpreted Dexcom CGM data. The results are scanned in the media section of the chart.   Patient's blood sugars are ranging from 60s to 400 range.  Average blood sugars are 190.  Patient has occasional hypoglycemia episodes with blood sugars dropping to 60s range.  50 OF the readings are within target range.  Patient has some postprandial hyperglycemia after meals.  Patient reports that she is occasionally forgetting to bolus with meals.  Patient also reports that the blood sugars are staying high even if she does not eat any meals. Patient denies any nausea or vomiting.  Diet/Exercise: Patient is following diet. EYE EXAM: Patient has regular eye exams.   The following portions of the patient's history were reviewed and updated as appropriate: allergies, current medications, past medical history, past social history and problem list.   Current Outpatient Medications:  .  aspirin  (Vazalore ) 81 mg cap, Take 81 mg by mouth Once Daily., Disp: , Rfl:  .  blood-glucose meter misc, Use meter to check blood sugar 4 times a day.  Please dispense based on availability, insurance coverage, and patient preference., Disp: 1 each, Rfl: 0 .  blood-glucose sensor (Dexcom G6 Sensor), Use 1 sensor every 10 days, Disp: 9 each, Rfl: 3 .  blood-glucose sensor (Dexcom G7 Sensor), Use 1 sensor every 10 days., Disp: 9 each, Rfl: 3 .  blood-glucose transmitter device (Dexcom G6 Transmitter) devi, Dispense 1 transmitter, Disp: 1 each, Rfl: 3 .  busPIRone  (BUSPAR ) 5 mg tablet, TAKE 1 TABLET BY MOUTH TWICE DAILY, Disp: 60 tablet, Rfl: 0 .  cyanocobalamin (VITAMIN B12) 250 mcg tablet,  Take 250 mcg by mouth Once Daily., Disp: , Rfl:  .  glucagon (Gvoke HypoPen 1-Pack) 1 mg/0.2 mL atIn, Inject 1 Syringe under the skin as needed., Disp: 0.4 mL, Rfl: 1 .  glucose blood (Accu-Chek Guide test strips) test strip, Use as instructed, Disp: 100 strip, Rfl: 3 .  insulin  lispro (HumaLOG) 100 unit/mL injection, Use as directed in insulin  pump. Max daily dose is 100 units., Disp: 90 mL, Rfl: 1 .  insulin  pump cartridge automated dosing (Omnipod 5 G6-G7 Pods, Gen 5,) crtg subcutaneous cartridge, Use 1 pod every 3 days., Disp: 30 each, Rfl: 1 .  Lancets misc, Use 1 lancet to lance skin to check blood sugar 4 times daily.  Please dispense based on availability, insurance coverage, and patient preference., Disp: 100 each, Rfl: 3 .  linaCLOtide (Linzess) 72 mcg cap capsule, Take 72 mcg by mouth Once Daily., Disp: 90 capsule, Rfl: 3 .  omeprazole (PriLOSEC) 40 mg DR capsule, Take 1 capsule (40 mg total) by mouth daily before dinner., Disp: 90 capsule, Rfl: 3 .  pen needle, diabetic 31 gauge x 3/16 ndle, Use to inject insulin  as prescribed., Disp: 400 each, Rfl: 3  No Known Allergies  Patient Active Problem List   Diagnosis Date Noted  Date Diagnosed  . UTI (urinary tract infection) 06/01/2023   . Factor V Leiden (HCC) 07/18/2021   . Protein S deficiency (HCC) 07/18/2021   . Callus 07/16/2021   . Female pelvic congestion syndrome 06/27/2021   . Chronic deep vein thrombosis (DVT) of  right lower extremity (HCC) 06/27/2021   . Venous insufficiency (chronic) (peripheral) 06/27/2021   . Opioid use disorder in remission 08/14/2020   . History of noncompliance with medical treatment, presenting hazards to health 04/01/2018   . Type 1 diabetes mellitus with hyperglycemia (HCC) 01/02/2018     Resolved Problems   Diagnosis Date Noted Date Resolved Date Diagnosed  . History of opioid abuse (CMD) 06/03/2023 06/03/2023   . Tobacco abuse 10/23/2020 06/03/2023   . Major depression, recurrent (HCC)  01/02/2018 06/03/2023     ROS: Appropriate review of system was done and it was negative except for as mentioned in HPI.   VITALS: Vitals:   01/14/24 1433  BP: 127/71  Pulse: 83    PHYSICAL EXAM:  DIABETIC FOOT EXAM:  Shoes and socks were removed.  No data recorded  normal DP and PT pulses, no trophic changes or ulcerative lesions, normal sensory exam, normal monofilament exam, dryness of skin, and edema absent Elizbeth Blanch, MD 01/14/2024 3:00 PM    LABS/RADIOLOGY/MEDICAL RECORDS:   HbA1c is 7.7 today which is better than last time     Recent Results (from the past 16 weeks)  POC Glucose (Hemocue/NOVA)   Collection Time: 01/14/24  2:49 PM  Result Value Ref Range   Glucose, POC 131 (A) 70 - 125 mg/dL   Kit/Device Lot # [id number]    Kit/Device Expiration Date 05/01/24   POC Hemoglobin A1c   Collection Time: 01/14/24  2:49 PM  Result Value Ref Range   Hemoglobin A1c (POC) 7.7 (A) 4.2 - 5.6 %   A1C Information      A1C Diagnostic Criteria: Prediabetes:5.7% - 6.4% Diabetes:>=6.5% A1C Glycemic Goals: Older Adults: <7.0% Children and Adolescents: <7.0% Pregnant Women: <6.0%   Kit/Device Lot # [id number]    Kit/Device Expiration Date 09/21/25      ORDERS: Orders Placed This Encounter  Procedures  . Lipid Panel  . TSH  . T4, Free  . Comprehensive Metabolic Panel  . Albumin, Random Urine  . Ambulatory referral to Diabetes Education and Medical Nutrition Therapy  . POC Glucose (Hemocue/NOVA)  . POC Hemoglobin A1c   Orders Placed This Encounter  Medications  . blood-glucose sensor (Dexcom G6 Sensor)    Sig: Use 1 sensor every 10 days    Dispense:  9 each    Refill:  3    Use 1 sensor every 10 days  . blood-glucose transmitter device (Dexcom G6 Transmitter) devi    Sig: Dispense 1 transmitter    Dispense:  1 each    Refill:  3    Dispense 1 transmitter  . insulin  pump cartridge automated dosing (Omnipod 5 G6-G7 Pods, Gen 5,) crtg subcutaneous cartridge    Sig: Use  1 pod every 3 days.    Dispense:  30 each    Refill:  1  . blood-glucose sensor (Dexcom G7 Sensor)    Sig: Use 1 sensor every 10 days.    Dispense:  9 each    Refill:  3    E10.65 on insulin .  . insulin  lispro (HumaLOG) 100 unit/mL injection    Sig: Use as directed in insulin  pump. Max daily dose is 100 units.    Dispense:  90 mL    Refill:  1  . glucose blood (Accu-Chek Guide test strips) test strip    Sig: Use as instructed    Dispense:  100 strip    Refill:  3     ASSESSMENT/PLAN: 1. Type 1 diabetes mellitus  with hyperglycemia (HCC) (Primary) I had a detailed discussion about further management of diabetes with the patient.  I have adjusted the correction factor to 50.  Also adjusted the max bolus to 10 units.  Hopefully this will improve the patient's diabetes control.  Continue with other insulin  pump settings.  I have upgraded the patient to Dexcom G7.  Patient will set up appointment with diabetic educator for set up of OmniPod 5 pump with Dexcom G7.  Repeat labs. - POC Glucose (Hemocue/NOVA) - POC Hemoglobin A1c - blood-glucose sensor (Dexcom G6 Sensor); Use 1 sensor every 10 days  Dispense: 9 each; Refill: 3 - blood-glucose transmitter device (Dexcom G6 Transmitter) devi; Dispense 1 transmitter  Dispense: 1 each; Refill: 3 - insulin  pump cartridge automated dosing (Omnipod 5 G6-G7 Pods, Gen 5,) crtg subcutaneous cartridge; Use 1 pod every 3 days.  Dispense: 30 each; Refill: 1 - Ambulatory referral to Diabetes Education and Medical Nutrition Therapy; Future - TSH; Future - T4, Free; Future - Comprehensive Metabolic Panel; Future - Albumin, Random Urine; Future  2. Hyperlipidemia, mixed Monitor lipid panel. - Lipid Panel; Future    This Document was created using the aid of voice recognition Dragon dictation software.

## 2024-03-17 NOTE — Progress Notes (Signed)
 Subjective:     Patient ID:  Rebecca Bryant is a 28 y.o. female. Patient comes in for Medication Refill .  HPI  Rebecca Bryant presents for management of anxiety and OCD tendencies. She has been taking buspar  5 mg bid and this is no longer controlling her sxs.  She feels she is having mood swings throughout the day and that she becomes over focused and obsessive about small things for which she makes extensive detailed lists and keeps checking them. She is aware this is excessive but cannot stop herself and becomes very anxious and overwhelmed.   The following portions of thepatient's history were reviewed and updated as appropriate: She  has a past medical history of Anxiety, Depression, Diabetes type 1, uncontrolled, and Heroin abuse (CMD).   She does not have any pertinent problems on file. She  has a past surgical history that includes Appendectomy. Her family history includes Alcohol abuse in her father; Asthma in her maternal grandmother; Bipolar disorder in her father; Depression in her father; Hypertension in her maternal grandmother; Thyroid disease in her maternal grandmother and mother. She  reports that she quit smoking about 2 years ago. Her smoking use included cigarettes. She has never been exposed to tobacco smoke. She has never used smokeless tobacco. She reports that she does not drink alcohol. No history on file for drug use. She has a current medication list which includes the following prescription(s): vazalore , blood-glucose meter, dexcom g6 sensor, dexcom g7 sensor, dexcom g6 transmitter, gvoke hypopen 1-pack, accu-chek guide test strips, insulin  lispro, omnipod 5 g6-g7 pods (gen 5), lancets, omeprazole, pen needle, diabetic, buspirone , fluoxetine, and linzess. Medications Ordered Prior to Encounter[1] She has no known allergies.. Problem List[2]  Review of Systems:  Complete Review of Systems negative except as stated in HPI or elsewhere in this document.  Objective:     Pertinent Labs Reviewed  Lab Results  Component Value Date   WBC 6.02 05/06/2023   HGB 13.7 05/06/2023   HCT 40.4 05/06/2023   PLT 236 05/06/2023   CHOL 142 04/08/2022   TRIG 59 04/08/2022   HDL 59 04/08/2022   LDLDIRECT 68 04/08/2022   ALT 49 05/06/2023   AST 34 05/06/2023   NA 138 05/06/2023   K 4.2 05/06/2023   CL 103 05/06/2023   CREATININE 0.75 05/06/2023   BUN 11 05/06/2023   CO2 29 05/06/2023   TSH 2.906 01/26/2024   HGBA1C 8.1 (A) 12/03/2022   No results found for: [facility] US  Peripheral Venous Leg Unilat Right Narrative: CLINICAL DATA:  Right lower extremity pain  EXAM: RIGHT LOWER EXTREMITY VENOUS DOPPLER ULTRASOUND  TECHNIQUE: Gray-scale sonography with compression, as well as color and duplex ultrasound, were performed to evaluate the deep venous system(s) from the level of the common femoral vein through the popliteal and proximal calf veins.  COMPARISON:  None available  FINDINGS: VENOUS  Normal compressibility of the common femoral, superficial femoral, and popliteal veins, as well as the visualized calf veins. Visualized portions of profunda femoral vein and great saphenous vein unremarkable. No filling defects to suggest DVT on grayscale or color Doppler imaging. Doppler waveforms show normal direction of venous flow, normal respiratory plasticity and response to augmentation.  Limited views of the contralateral common femoral vein are unremarkable.  OTHER  None.  Limitations: none Impression: No right lower extremity DVT  Electronically Signed   By: Rebecca Bryant M.D.   On: 05/06/2023 08:32  Physical Exam  Vitals:   03/17/24 1147  BP:  130/80  Pulse: 85  Temp: 97.7 F (36.5 C)  SpO2: 97%    General appearance: alert, appears stated age and cooperative Neck: no adenopathy, no carotid bruit, no JVD, supple, symmetrical, trachea midline and thyroid not enlarged, symmetric, notenderness/mass/nodules Lungs: clear to auscultation  bilaterally Heart: regular rate and rhythm, S1, S2 normal, no murmur, click, rub or gallop Extremities: extremities normal, atraumatic, no cyanosis or edema Pulses: 2+ and symmetric   Assessment:         see below   Plan:      Plan: 1. Adjustment disorder with anxious mood (Primary) Rx prozac 10 mg po every day and increased buspar  to 10 mg po bid prn F/u 4 weeks    The [place]  Controlled Substances Reporting System has been reviewed today as part of our clinic protocol. Patient expresses understanding of their current medications and use.  If a new prescription was given today, then I discussed potential side effects, drug interactions, instructions for taking the medication, and the consequences of not taking it.   Patient verbalized an understanding of these instructions. Patient is able to verbalize understanding of the care plan discussed today. Patient's medical and personal goals were discussed today.to current goals:  None The following portions of the patient's history were reviewed and updated as appropriate: allergies, current medications, past family history, past medical history, past social history,past surgical history and problem list. Return in about 4 weeks (around 04/14/2024) for anxiety.   Call sooner if needed.  This document serves as a record of services personally performed by Truman CINDERELLA Romney, MD  .  It was created on their behalf by Truman CINDERELLA Romney, MD, a trained medical scribe. During the course of documenting the history, physical exam and medical decision making, I was functioning as a Stage manager. The creation of this record is the provider's dictation and/or activities during the visit.  Electronically signed by Truman CINDERELLA Romney, MD 03/17/2024 11:41 AM       I agree the documentation is accurate and complete.  Electronically signed by: Truman CINDERELLA Romney, MD 03/18/2024 9:41 AM   Make me the dino       [1] Current  Outpatient Medications on File Prior to Visit  Medication Sig Dispense Refill  . aspirin  (Vazalore ) 81 mg cap Take 81 mg by mouth Once Daily.    . blood-glucose meter misc Use meter to check blood sugar 4 times a day.  Please dispense based on availability, insurance coverage, and patient preference. 1 each 0  . blood-glucose sensor (Dexcom G6 Sensor) Use 1 sensor every 10 days 9 each 3  . blood-glucose sensor (Dexcom G7 Sensor) Use 1 sensor every 10 days. 9 each 3  . blood-glucose transmitter device (Dexcom G6 Transmitter) devi Dispense 1 transmitter 1 each 3  . glucagon (Gvoke HypoPen 1-Pack) 1 mg/0.2 mL atIn Inject 1 Syringe under the skin as needed. 0.4 mL 1  . glucose blood (Accu-Chek Guide test strips) test strip Use as instructed 100 strip 3  . insulin  lispro (HumaLOG) 100 unit/mL injection Use as directed in insulin  pump. Max daily dose is 100 units. 90 mL 1  . insulin  pump cartridge automated dosing (Omnipod 5 G6-G7 Pods, Gen 5,) crtg subcutaneous cartridge Use 1 pod every 3 days. 30 each 1  . Lancets misc Use 1 lancet to lance skin to check blood sugar 4 times daily.  Please dispense based on availability, insurance coverage, and patient preference. 100 each 3  . omeprazole (PriLOSEC)  40 mg DR capsule Take 1 capsule (40 mg total) by mouth daily before dinner. 90 capsule 3  . pen needle, diabetic 31 gauge x 3/16 ndle Use to inject insulin  as prescribed. 400 each 3  . linaCLOtide (Linzess) 72 mcg cap capsule Take 72 mcg by mouth Once Daily. (Patient not taking: Reported on 01/26/2024) 90 capsule 3   No current facility-administered medications on file prior to visit.  [2] Patient Active Problem List Diagnosis  . Type 1 diabetes mellitus with hyperglycemia (HCC)  . History of noncompliance with medical treatment, presenting hazards to health  . Opioid use disorder in remission  . Female pelvic congestion syndrome  . Chronic deep vein thrombosis (DVT) of right lower extremity (HCC)  .  Venous insufficiency (chronic) (peripheral)  . Callus  . Factor V Leiden (HCC)  . Protein S deficiency (HCC)  . UTI (urinary tract infection)

## 2024-04-08 ENCOUNTER — Encounter (HOSPITAL_COMMUNITY): Payer: Self-pay | Admitting: Emergency Medicine

## 2024-04-08 ENCOUNTER — Emergency Department (HOSPITAL_COMMUNITY)
Admission: EM | Admit: 2024-04-08 | Discharge: 2024-04-08 | Disposition: A | Discharge status: Home / Self Care | Payer: MEDICAID | Attending: Emergency Medicine | Admitting: Emergency Medicine

## 2024-04-08 ENCOUNTER — Other Ambulatory Visit: Payer: Self-pay

## 2024-04-08 DIAGNOSIS — E109 Type 1 diabetes mellitus without complications: Secondary | ICD-10-CM | POA: Insufficient documentation

## 2024-04-08 DIAGNOSIS — R112 Nausea with vomiting, unspecified: Secondary | ICD-10-CM | POA: Insufficient documentation

## 2024-04-08 DIAGNOSIS — D72829 Elevated white blood cell count, unspecified: Secondary | ICD-10-CM | POA: Diagnosis not present

## 2024-04-08 DIAGNOSIS — R1013 Epigastric pain: Secondary | ICD-10-CM | POA: Diagnosis not present

## 2024-04-08 DIAGNOSIS — R109 Unspecified abdominal pain: Secondary | ICD-10-CM | POA: Diagnosis present

## 2024-04-08 LAB — COMPREHENSIVE METABOLIC PANEL WITH GFR
ALT: 34 U/L (ref 0–44)
AST: 33 U/L (ref 15–41)
Albumin: 4.2 g/dL (ref 3.5–5.0)
Alkaline Phosphatase: 36 U/L — ABNORMAL LOW (ref 38–126)
Anion gap: 13 (ref 5–15)
BUN: 12 mg/dL (ref 6–20)
CO2: 20 mmol/L — ABNORMAL LOW (ref 22–32)
Calcium: 8.5 mg/dL — ABNORMAL LOW (ref 8.9–10.3)
Chloride: 103 mmol/L (ref 98–111)
Creatinine, Ser: 0.6 mg/dL (ref 0.44–1.00)
GFR, Estimated: >60 mL/min (ref >60)
Glucose, Bld: 238 mg/dL — ABNORMAL HIGH (ref 70–99)
Potassium: 3.5 mmol/L (ref 3.5–5.1)
Sodium: 136 mmol/L (ref 135–145)
Total Bilirubin: 1.1 mg/dL (ref 0.0–1.2)
Total Protein: 7.3 g/dL (ref 6.5–8.1)

## 2024-04-08 LAB — URINALYSIS, ROUTINE W REFLEX MICROSCOPIC
Bacteria, UA: NONE SEEN
Bilirubin Urine: NEGATIVE
Glucose, UA: >=500 mg/dL — AB
Hgb urine dipstick: NEGATIVE
Ketones, ur: 80 mg/dL — AB
Leukocytes,Ua: NEGATIVE
Nitrite: NEGATIVE
Protein, ur: NEGATIVE mg/dL
Specific Gravity, Urine: 1.024 (ref 1.005–1.030)
pH: 7 (ref 5.0–8.0)

## 2024-04-08 LAB — HCG, QUANTITATIVE, PREGNANCY: hCG, Beta Chain, Quant, S: <1 m[IU]/mL (ref <5)

## 2024-04-08 LAB — CBC
HCT: 38.7 % (ref 36.0–46.0)
Hemoglobin: 13.1 g/dL (ref 12.0–15.0)
MCH: 30.6 pg (ref 26.0–34.0)
MCHC: 33.9 g/dL (ref 30.0–36.0)
MCV: 90.4 fL (ref 80.0–100.0)
Platelets: 261 10*3/uL (ref 150–400)
RBC: 4.28 MIL/uL (ref 3.87–5.11)
RDW: 13 % (ref 11.5–15.5)
WBC: 13.7 10*3/uL — ABNORMAL HIGH (ref 4.0–10.5)
nRBC: 0 % (ref 0.0–0.2)

## 2024-04-08 LAB — LIPASE, BLOOD: Lipase: 24 U/L (ref 11–51)

## 2024-04-08 LAB — MAGNESIUM: Magnesium: 1.5 mg/dL — ABNORMAL LOW (ref 1.7–2.4)

## 2024-04-08 MED ORDER — PANTOPRAZOLE SODIUM 40 MG IV SOLR
40.0000 mg | Freq: Once | INTRAVENOUS | Status: AC
  Administered 2024-04-08: 40 mg via INTRAVENOUS
  Filled 2024-04-08: qty 10

## 2024-04-08 MED ORDER — METOCLOPRAMIDE HCL 10 MG PO TABS: 10.0000 mg | ORAL_TABLET | Freq: Four times a day (QID) | ORAL | 0 refills | Status: DC | PRN

## 2024-04-08 MED ORDER — MORPHINE SULFATE (PF) 4 MG/ML IV SOLN
4.0000 mg | Freq: Once | INTRAVENOUS | Status: AC
  Administered 2024-04-08: 4 mg via INTRAVENOUS
  Filled 2024-04-08: qty 1

## 2024-04-08 MED ORDER — SODIUM CHLORIDE 0.9 % IV BOLUS
1000.0000 mL | Freq: Once | INTRAVENOUS | Status: AC
  Administered 2024-04-08: 1000 mL via INTRAVENOUS

## 2024-04-08 MED ORDER — METOCLOPRAMIDE HCL 5 MG/ML IJ SOLN
10.0000 mg | Freq: Once | INTRAMUSCULAR | Status: AC
  Administered 2024-04-08: 10 mg via INTRAVENOUS
  Filled 2024-04-08: qty 2

## 2024-04-08 NOTE — Discharge Instructions (Signed)

## 2024-04-08 NOTE — ED Triage Notes (Signed)
 Pt arriving via [facility] for abdominal pain, n/v x2 days. Pt is Type I diabetic. Unable to keep down anything. 500mL NS, 4mg  Zofran . CNG with EMS 258

## 2024-04-08 NOTE — ED Provider Notes (Signed)
 Emergency Department Provider Note   I have reviewed the triage vital signs and the nursing notes.   HISTORY  Chief Complaint Abdominal Pain   HPI Rebecca Bryant is a 28 y.o. female with a past history of diabetes and factor V deficiency presents emergency department with cramping abdominal pain with nausea and vomiting.  Symptoms been developing over the past 2 days.  She is a type I diabetic with an insulin  pump.  She has been using this but has been unable to keep down any food or liquids.  Describes upper abdomen pain which is cramping/squeezing in nature.  No lower abdominal tenderness.  No radiation up into the chest.  No fevers.  Ultimately, she called EMS who administered 500 mL IV fluid along with Zofran .  Blood sugar with EMS 258.   Past Medical History:  Diagnosis Date   Diabetes mellitus without complication (HCC)    DVT (deep venous thrombosis) (HCC)    Factor V deficiency (HCC)     Review of Systems  Constitutional: No fever/chills Cardiovascular: Denies chest pain. Respiratory: Denies shortness of breath. Gastrointestinal: Positive epigastric abdominal pain. Positive nausea and vomiting.  No diarrhea.  No constipation. Skin: Negative for rash. Neurological: Negative for headaches.  ____________________________________________   PHYSICAL EXAM:  VITAL SIGNS: ED Triage Vitals  Encounter Vitals Group     BP 04/08/24 1257 (!) 133/119     Pulse Rate 04/08/24 1257 68     Resp 04/08/24 1257 16     Temp 04/08/24 1257 98.4 F (36.9 C)     Temp src --      SpO2 04/08/24 1257 100 %     Weight 04/08/24 1346 175 lb (79.4 kg)     Height 04/08/24 1346 5' 9 (1.753 m)   Constitutional: Alert and oriented. Well appearing and in no acute distress. Eyes: Conjunctivae are normal.  Head: Atraumatic. Nose: No congestion/rhinnorhea. Mouth/Throat: Mucous membranes are moist.  Neck: No stridor.   Cardiovascular: Normal rate, regular rhythm. Good peripheral  circulation. Grossly normal heart sounds.   Respiratory: Normal respiratory effort.  No retractions. Lungs CTAB. Gastrointestinal: Soft with mid-epigastric tenderness. No distention.  Musculoskeletal:  No gross deformities of extremities. Neurologic:  Normal speech and language.  Skin:  Skin is warm, dry and intact. No rash noted.   ____________________________________________   LABS (all labs ordered are listed, but only abnormal results are displayed)  Labs Reviewed  COMPREHENSIVE METABOLIC PANEL WITH GFR - Abnormal; Notable for the following components:      Result Value   CO2 20 (*)    Glucose, Bld 238 (*)    Calcium 8.5 (*)    Alkaline Phosphatase 36 (*)    All other components within normal limits  CBC - Abnormal; Notable for the following components:   WBC 13.7 (*)    All other components within normal limits  URINALYSIS, ROUTINE W REFLEX MICROSCOPIC - Abnormal; Notable for the following components:   Glucose, UA >=500 (*)    Ketones, ur 80 (*)    All other components within normal limits  MAGNESIUM - Abnormal; Notable for the following components:   Magnesium 1.5 (*)    All other components within normal limits  LIPASE, BLOOD  HCG, QUANTITATIVE, PREGNANCY   ____________________________________________  EKG   EKG Interpretation Date/Time:  Thursday April 08 2024 13:32:13 EDT Ventricular Rate:  71 PR Interval:  125 QRS Duration:  84 QT Interval:  435 QTC Calculation: 473 R Axis:   62  Text  Interpretation: Sinus rhythm Abnormal Q suggests anterior infarct Nonspecific T abnrm, anterolateral leads Confirmed by Darra Chew [id number]) on 04/08/2024 1:56:55 PM        ____________________________________________  RADIOLOGY  No results found.  ____________________________________________   PROCEDURES  Procedure(s) performed:   Procedures   ____________________________________________   INITIAL IMPRESSION / ASSESSMENT AND PLAN / ED COURSE  Pertinent  labs & imaging results that were available during my care of the patient were reviewed by me and considered in my medical decision making (see chart for details).   This patient is Presenting for Evaluation of abdominal pain, which does require a range of treatment options, and is a complaint that involves a high risk of morbidity and mortality.  The Differential Diagnoses includes but is not exclusive to acute cholecystitis, intrathoracic causes for epigastric abdominal pain, gastritis, duodenitis, pancreatitis, small bowel or large bowel obstruction, abdominal aortic aneurysm, hernia, gastritis, etc.   Critical Interventions-    Medications  sodium chloride  0.9 % bolus 1,000 mL (0 mLs Intravenous Stopped 04/08/24 1448)  metoCLOPramide  (REGLAN ) injection 10 mg (10 mg Intravenous Given 04/08/24 1324)  morphine  (PF) 4 MG/ML injection 4 mg (4 mg Intravenous Given 04/08/24 1321)  pantoprazole (PROTONIX) injection 40 mg (40 mg Intravenous Given 04/08/24 1324)    Reassessment after intervention: symptoms improved.    Clinical Laboratory Tests Ordered, included CBC with leukocytosis to 13.7.  No anemia.  UA without infection.  No acute kidney injury. Lipase negative. Pregnancy negative.   Cardiac Monitor Tracing which shows NSR.    Social Determinants of Health Risk patient is a non-smoker.   Medical Decision Making: Summary:  Patient presents emergency department with abdominal pain along with nausea and vomiting.  Mild, midepigastric tenderness raising suspicion for gastritis.  She does use THC but no alcohol or other drugs.  No immediate plans for emergent CT. Will r/o DKA but screening labs ordered.   Reevaluation with update and discussion with patient.  She is feeling much better on reassessment.  No evidence of DKA.  Mild low magnesium.  Vomiting and pain have resolved.  Continue to hold on CT abdomen pelvis.  Considered admission but labs are reassuring. Stable for discharge.   Patient's  presentation is most consistent with acute presentation with potential threat to life or bodily function.   Disposition: discharge  ____________________________________________  FINAL CLINICAL IMPRESSION(S) / ED DIAGNOSES  Final diagnoses:  Epigastric pain  Nausea and vomiting, unspecified vomiting type     NEW OUTPATIENT MEDICATIONS STARTED DURING THIS VISIT:  Current Discharge Medication List     START taking these medications   Details  metoCLOPramide  (REGLAN ) 10 MG tablet Take 1 tablet (10 mg total) by mouth every 6 (six) hours as needed for nausea or vomiting. Qty: 30 tablet, Refills: 0        Note:  This document was prepared using Dragon voice recognition software and may include unintentional dictation errors.  Chew Darra, MD, [facility]    Edrie Ehrich, Chew MATSU, MD 04/08/24 [phone]

## 2024-04-09 ENCOUNTER — Emergency Department (HOSPITAL_COMMUNITY): Payer: MEDICAID

## 2024-04-09 ENCOUNTER — Emergency Department (HOSPITAL_COMMUNITY)
Admission: EM | Admit: 2024-04-09 | Discharge: 2024-04-09 | Disposition: A | Discharge status: Home / Self Care | Payer: MEDICAID | Attending: Emergency Medicine | Admitting: Emergency Medicine

## 2024-04-09 DIAGNOSIS — R109 Unspecified abdominal pain: Secondary | ICD-10-CM

## 2024-04-09 DIAGNOSIS — D72829 Elevated white blood cell count, unspecified: Secondary | ICD-10-CM | POA: Diagnosis not present

## 2024-04-09 DIAGNOSIS — R112 Nausea with vomiting, unspecified: Secondary | ICD-10-CM | POA: Diagnosis not present

## 2024-04-09 DIAGNOSIS — R1010 Upper abdominal pain, unspecified: Secondary | ICD-10-CM | POA: Insufficient documentation

## 2024-04-09 DIAGNOSIS — E109 Type 1 diabetes mellitus without complications: Secondary | ICD-10-CM | POA: Insufficient documentation

## 2024-04-09 DIAGNOSIS — Z794 Long term (current) use of insulin: Secondary | ICD-10-CM | POA: Insufficient documentation

## 2024-04-09 DIAGNOSIS — Z7982 Long term (current) use of aspirin: Secondary | ICD-10-CM | POA: Insufficient documentation

## 2024-04-09 LAB — URINALYSIS, ROUTINE W REFLEX MICROSCOPIC
Bacteria, UA: NONE SEEN
Bilirubin Urine: NEGATIVE
Glucose, UA: >=500 mg/dL — AB
Hgb urine dipstick: NEGATIVE
Ketones, ur: 20 mg/dL — AB
Leukocytes,Ua: NEGATIVE
Nitrite: NEGATIVE
Protein, ur: NEGATIVE mg/dL
Specific Gravity, Urine: 1.026 (ref 1.005–1.030)
pH: 5 (ref 5.0–8.0)

## 2024-04-09 LAB — CBC WITH DIFFERENTIAL/PLATELET
Abs Immature Granulocytes: 0.05 K/uL (ref 0.00–0.07)
Basophils Absolute: 0.1 K/uL (ref 0.0–0.1)
Basophils Relative: 0 %
Eosinophils Absolute: 0 K/uL (ref 0.0–0.5)
Eosinophils Relative: 0 %
HCT: 41.2 % (ref 36.0–46.0)
Hemoglobin: 13.4 g/dL (ref 12.0–15.0)
Immature Granulocytes: 0 %
Lymphocytes Relative: 7 %
Lymphs Abs: 1.1 K/uL (ref 0.7–4.0)
MCH: 29.8 pg (ref 26.0–34.0)
MCHC: 32.5 g/dL (ref 30.0–36.0)
MCV: 91.8 fL (ref 80.0–100.0)
Monocytes Absolute: 0.6 K/uL (ref 0.1–1.0)
Monocytes Relative: 4 %
Neutro Abs: 14 K/uL — ABNORMAL HIGH (ref 1.7–7.7)
Neutrophils Relative %: 89 %
Platelets: 298 K/uL (ref 150–400)
RBC: 4.49 MIL/uL (ref 3.87–5.11)
RDW: 13.2 % (ref 11.5–15.5)
WBC: 15.8 K/uL — ABNORMAL HIGH (ref 4.0–10.5)
nRBC: 0 % (ref 0.0–0.2)

## 2024-04-09 LAB — COMPREHENSIVE METABOLIC PANEL WITH GFR
ALT: 35 U/L (ref 0–44)
AST: 34 U/L (ref 15–41)
Albumin: 4.7 g/dL (ref 3.5–5.0)
Alkaline Phosphatase: 45 U/L (ref 38–126)
Anion gap: 13 (ref 5–15)
BUN: 13 mg/dL (ref 6–20)
CO2: 22 mmol/L (ref 22–32)
Calcium: 9.1 mg/dL (ref 8.9–10.3)
Chloride: 102 mmol/L (ref 98–111)
Creatinine, Ser: 0.93 mg/dL (ref 0.44–1.00)
GFR, Estimated: >60 mL/min (ref >60)
Glucose, Bld: 294 mg/dL — ABNORMAL HIGH (ref 70–99)
Potassium: 3.7 mmol/L (ref 3.5–5.1)
Sodium: 137 mmol/L (ref 135–145)
Total Bilirubin: 1.1 mg/dL (ref 0.0–1.2)
Total Protein: 7.7 g/dL (ref 6.5–8.1)

## 2024-04-09 LAB — LIPASE, BLOOD: Lipase: 25 U/L (ref 11–51)

## 2024-04-09 MED ORDER — IOHEXOL 300 MG/ML  SOLN
100.0000 mL | Freq: Once | INTRAMUSCULAR | Status: AC | PRN
  Administered 2024-04-09: 100 mL via INTRAVENOUS

## 2024-04-09 MED ORDER — DICYCLOMINE HCL 10 MG PO CAPS
10.0000 mg | ORAL_CAPSULE | Freq: Once | ORAL | Status: AC
  Administered 2024-04-09: 10 mg via ORAL
  Filled 2024-04-09: qty 1

## 2024-04-09 MED ORDER — DICYCLOMINE HCL 20 MG PO TABS: 20.0000 mg | ORAL_TABLET | Freq: Two times a day (BID) | ORAL | 0 refills | Status: AC

## 2024-04-09 MED ORDER — PROMETHAZINE HCL 25 MG RE SUPP: 25.0000 mg | Freq: Four times a day (QID) | RECTAL | 0 refills | Status: DC | PRN

## 2024-04-09 MED ORDER — SODIUM CHLORIDE 0.9 % IV BOLUS
1000.0000 mL | Freq: Once | INTRAVENOUS | Status: AC
  Administered 2024-04-09: 1000 mL via INTRAVENOUS

## 2024-04-09 MED ORDER — ONDANSETRON HCL 4 MG/2ML IJ SOLN
4.0000 mg | Freq: Once | INTRAMUSCULAR | Status: AC
  Administered 2024-04-09: 4 mg via INTRAVENOUS
  Filled 2024-04-09: qty 2

## 2024-04-09 MED ORDER — MORPHINE SULFATE (PF) 4 MG/ML IV SOLN
4.0000 mg | Freq: Once | INTRAVENOUS | Status: AC
  Administered 2024-04-09: 4 mg via INTRAVENOUS
  Filled 2024-04-09: qty 1

## 2024-04-09 NOTE — ED Triage Notes (Signed)
 Pt arriving via [facility] from home for abdominal pain and n/v. Pt seen for same yesterday. Pt said she felt fine when she left yesterday but last night she began vomiting again. Pain 9/10 at this time.

## 2024-04-09 NOTE — Discharge Instructions (Signed)
 You have been seen and discharged from the emergency department.  Your blood work and CAT scan were normal.  Please follow-up with your gastroenterologist for further testing.  Take Bentyl  and use suppositories as prescribed and as needed.  Follow-up with your primary provider for further evaluation and further care. Take home medications as prescribed. If you have any worsening symptoms or further concerns for your health please return to an emergency department for further evaluation.

## 2024-04-09 NOTE — ED Provider Notes (Signed)
 [place] EMERGENCY DEPARTMENT AT [facility] Provider Note   CSN: [id number] Arrival date & time: 04/09/24  9245     Patient presents with: Abdominal Pain   Rebecca Bryant is a 28 y.o. female.   HPI   28 year old female presents emergency department ongoing abdominal cramping, nausea/vomiting.  Was seen yesterday with similar complaints.  Symptoms have been ongoing for about 4 days in total.  She is a type I diabetic with insulin  pump.  States that it appears to be functioning normally.  Pain is mainly upper abdomen but also diffuse.  Cramping squeezing in nature.  No radiation around to the back or chest.  Denies any fever or chills.  No blood in the emesis.  Last menstrual period was about 2 weeks ago, pregnancy test yesterday was negative.  Prior to Admission medications   Medication Sig Start Date End Date Taking? Authorizing Provider  aspirin  EC 81 MG tablet Take 81 mg by mouth daily. Swallow whole.    [provider]  busPIRone  (BUSPAR ) 5 MG tablet Take 5 mg by mouth 2 (two) times daily as needed (anxiety).    [provider]  Continuous Blood Gluc Receiver (DEXCOM G6 RECEIVER) DEVI Use as directed for continuous glucose monitoring. 07/18/20   [provider]  Continuous Blood Gluc Receiver (DEXCOM G6 RECEIVER) DEVI USE AS DIRECTED FOR CONTINOUS GLUCOSE MONITORING 08/03/20   [provider]  insulin  aspart (NOVOLOG ) 100 UNIT/ML injection Inject 3 Units into the skin 3 (three) times daily with meals. 09/14/20   Swayze, Ava, DO  metoCLOPramide  (REGLAN ) 10 MG tablet Take 1 tablet (10 mg total) by mouth every 6 (six) hours as needed for nausea or vomiting. 04/08/24   Long, Fonda MATSU, MD  ondansetron  (ZOFRAN ) 4 MG tablet Take 1 tablet (4 mg total) by mouth every 6 (six) hours. 08/05/23   Robinson, John K, PA-C    Allergies: Patient has no known allergies.    Review of Systems  Constitutional:  Positive for appetite change, chills and  fatigue. Negative for fever.  Respiratory:  Negative for shortness of breath.   Cardiovascular:  Negative for chest pain.  Gastrointestinal:  Positive for abdominal pain, nausea and vomiting. Negative for abdominal distention, blood in stool and diarrhea.  Skin:  Negative for rash.  Neurological:  Negative for headaches.    Updated Vital Signs BP (!) 152/77 (BP Location: Right Arm)   Pulse 73   Temp 97.7 F (36.5 C) (Oral)   Resp 16   LMP 03/26/2024   SpO2 99%   Physical Exam Vitals and nursing note reviewed.  Constitutional:      Appearance: Normal appearance. She is ill-appearing.  HENT:     Head: Normocephalic.     Mouth/Throat:     Mouth: Mucous membranes are moist.  Cardiovascular:     Rate and Rhythm: Normal rate.  Pulmonary:     Effort: Pulmonary effort is normal. No respiratory distress.  Abdominal:     General: Bowel sounds are normal.     Palpations: Abdomen is soft.     Tenderness: There is generalized abdominal tenderness. There is no guarding or rebound.  Skin:    General: Skin is warm.  Neurological:     Mental Status: She is alert and oriented to person, place, and time. Mental status is at baseline.  Psychiatric:        Mood and Affect: Mood normal.     (all labs ordered are listed, but only abnormal results  are displayed) Labs Reviewed  CBC WITH DIFFERENTIAL/PLATELET  COMPREHENSIVE METABOLIC PANEL WITH GFR  LIPASE, BLOOD  URINALYSIS, ROUTINE W REFLEX MICROSCOPIC    EKG: None  Radiology: No results found.   Procedures   Medications Ordered in the ED  sodium chloride  0.9 % bolus 1,000 mL (has no administration in time range)  ondansetron  (ZOFRAN ) injection 4 mg (has no administration in time range)  morphine  (PF) 4 MG/ML injection 4 mg (has no administration in time range)                                    Medical Decision Making Amount and/or Complexity of Data Reviewed Labs: ordered. Radiology: ordered.  Risk Prescription  drug management.   28 year old female presents emergency with abdominal cramping, nausea/vomiting.  Vitals are normal on arrival, abdomen is diffusely tender but nonacute.  Blood work shows mild leukocytosis but is otherwise reassuring.  CT of the abdomen pelvis shows no acute finding.  After a dose of Bentyl  patient has had the most relief.  Discussed with the patient following up with her gastroenterologist as an outpatient and will prescribe symptomatic treatment in the meantime.  Patient at this time appears safe and stable for discharge and close outpatient follow up. Discharge plan and strict return to ED precautions discussed, patient verbalizes understanding and agreement.     Final diagnoses:  None    ED Discharge Orders     None          Bari Roxie HERO, DO 04/09/24 1546

## 2024-04-11 ENCOUNTER — Other Ambulatory Visit: Payer: Self-pay

## 2024-04-11 ENCOUNTER — Emergency Department (HOSPITAL_COMMUNITY)
Admission: EM | Admit: 2024-04-11 | Discharge: 2024-04-11 | Disposition: A | Discharge status: Home / Self Care | Payer: MEDICAID | Attending: Emergency Medicine | Admitting: Emergency Medicine

## 2024-04-11 DIAGNOSIS — R112 Nausea with vomiting, unspecified: Secondary | ICD-10-CM | POA: Insufficient documentation

## 2024-04-11 DIAGNOSIS — Z794 Long term (current) use of insulin: Secondary | ICD-10-CM | POA: Diagnosis not present

## 2024-04-11 DIAGNOSIS — R197 Diarrhea, unspecified: Secondary | ICD-10-CM | POA: Insufficient documentation

## 2024-04-11 DIAGNOSIS — Z7982 Long term (current) use of aspirin: Secondary | ICD-10-CM | POA: Diagnosis not present

## 2024-04-11 LAB — CBC WITH DIFFERENTIAL/PLATELET
Abs Immature Granulocytes: 0.03 K/uL (ref 0.00–0.07)
Basophils Absolute: 0.1 K/uL (ref 0.0–0.1)
Basophils Relative: 1 %
Eosinophils Absolute: 0 K/uL (ref 0.0–0.5)
Eosinophils Relative: 0 %
HCT: 39.9 % (ref 36.0–46.0)
Hemoglobin: 13.4 g/dL (ref 12.0–15.0)
Immature Granulocytes: 0 %
Lymphocytes Relative: 4 %
Lymphs Abs: 0.4 K/uL — ABNORMAL LOW (ref 0.7–4.0)
MCH: 30.1 pg (ref 26.0–34.0)
MCHC: 33.6 g/dL (ref 30.0–36.0)
MCV: 89.7 fL (ref 80.0–100.0)
Monocytes Absolute: 0.6 K/uL (ref 0.1–1.0)
Monocytes Relative: 7 %
Neutro Abs: 8.6 K/uL — ABNORMAL HIGH (ref 1.7–7.7)
Neutrophils Relative %: 88 %
Platelets: 230 K/uL (ref 150–400)
RBC: 4.45 MIL/uL (ref 3.87–5.11)
RDW: 13.2 % (ref 11.5–15.5)
WBC: 9.7 K/uL (ref 4.0–10.5)
nRBC: 0 % (ref 0.0–0.2)

## 2024-04-11 LAB — COMPREHENSIVE METABOLIC PANEL WITH GFR
ALT: 32 U/L (ref 0–44)
AST: 26 U/L (ref 15–41)
Albumin: 4.2 g/dL (ref 3.5–5.0)
Alkaline Phosphatase: 36 U/L — ABNORMAL LOW (ref 38–126)
Anion gap: 13 (ref 5–15)
BUN: 10 mg/dL (ref 6–20)
CO2: 21 mmol/L — ABNORMAL LOW (ref 22–32)
Calcium: 8.7 mg/dL — ABNORMAL LOW (ref 8.9–10.3)
Chloride: 104 mmol/L (ref 98–111)
Creatinine, Ser: 0.75 mg/dL (ref 0.44–1.00)
GFR, Estimated: >60 mL/min (ref >60)
Glucose, Bld: 175 mg/dL — ABNORMAL HIGH (ref 70–99)
Potassium: 2.9 mmol/L — ABNORMAL LOW (ref 3.5–5.1)
Sodium: 138 mmol/L (ref 135–145)
Total Bilirubin: 1.1 mg/dL (ref 0.0–1.2)
Total Protein: 7.1 g/dL (ref 6.5–8.1)

## 2024-04-11 MED ORDER — POTASSIUM CHLORIDE 10 MEQ/100ML IV SOLN
10.0000 meq | INTRAVENOUS | Status: AC
  Administered 2024-04-11 (×2): 10 meq via INTRAVENOUS
  Filled 2024-04-11 (×2): qty 100

## 2024-04-11 MED ORDER — POTASSIUM CHLORIDE CRYS ER 20 MEQ PO TBCR: 20.0000 meq | EXTENDED_RELEASE_TABLET | Freq: Two times a day (BID) | ORAL | 0 refills | Status: AC

## 2024-04-11 MED ORDER — LACTATED RINGERS IV BOLUS
1000.0000 mL | Freq: Once | INTRAVENOUS | Status: AC
  Administered 2024-04-11: 1000 mL via INTRAVENOUS

## 2024-04-11 MED ORDER — MAGNESIUM SULFATE 2 GM/50ML IV SOLN
2.0000 g | Freq: Once | INTRAVENOUS | Status: AC
  Administered 2024-04-11: 2 g via INTRAVENOUS
  Filled 2024-04-11: qty 50

## 2024-04-11 MED ORDER — PROCHLORPERAZINE EDISYLATE 10 MG/2ML IJ SOLN
5.0000 mg | Freq: Once | INTRAMUSCULAR | Status: AC
  Administered 2024-04-11: 5 mg via INTRAVENOUS
  Filled 2024-04-11: qty 2

## 2024-04-11 NOTE — Discharge Instructions (Signed)
 Your potassium is low.  Try and keep yourself hydrated.  Take the potassium supplementation once you are no longer vomiting.

## 2024-04-11 NOTE — ED Provider Notes (Signed)
 [place] EMERGENCY DEPARTMENT AT [facility] Provider Note   CSN: [id number] Arrival date & time: 04/11/24  1326     Patient presents with: Nausea, Emesis, and Diarrhea   Rebecca Bryant is a 28 y.o. female.    Emesis Associated symptoms: diarrhea   Diarrhea Associated symptoms: vomiting   Patient presents with third visit over the last 4 days.  Nausea vomiting diarrhea.  Has had around 5 days of symptoms now.  States continued symptoms.  Had been feeling better when she had been discharged but now still feeling bad.  Abdominal pain.  Crampiness.  No known sick contacts.  Has had nausea and dry heaves.  Some vomiting.  Decreased diarrhea however.  Had CT scan 2 days ago.     Prior to Admission medications   Medication Sig Start Date End Date Taking? Authorizing Provider  potassium chloride  SA (KLOR-CON  M) 20 MEQ tablet Take 1 tablet (20 mEq total) by mouth 2 (two) times daily. 04/11/24  Yes Patsey Lot, MD  aspirin  EC 81 MG tablet Take 81 mg by mouth daily. Swallow whole.    [provider]  busPIRone  (BUSPAR ) 5 MG tablet Take 5 mg by mouth 2 (two) times daily as needed (anxiety).    [provider]  Continuous Blood Gluc Receiver (DEXCOM G6 RECEIVER) DEVI Use as directed for continuous glucose monitoring. 07/18/20   [provider]  Continuous Blood Gluc Receiver (DEXCOM G6 RECEIVER) DEVI USE AS DIRECTED FOR CONTINOUS GLUCOSE MONITORING 08/03/20   [provider]  dicyclomine  (BENTYL ) 20 MG tablet Take 1 tablet (20 mg total) by mouth 2 (two) times daily. 04/09/24   Horton, Roxie HERO, DO  insulin  aspart (NOVOLOG ) 100 UNIT/ML injection Inject 3 Units into the skin 3 (three) times daily with meals. 09/14/20   Swayze, Ava, DO  metoCLOPramide  (REGLAN ) 10 MG tablet Take 1 tablet (10 mg total) by mouth every 6 (six) hours as needed for nausea or vomiting. 04/08/24   Long, Fonda MATSU, MD  ondansetron  (ZOFRAN ) 4 MG tablet Take 1 tablet (4 mg total)  by mouth every 6 (six) hours. 08/05/23   Robinson, John K, PA-C  promethazine  (PHENERGAN ) 25 MG suppository Place 1 suppository (25 mg total) rectally every 6 (six) hours as needed for nausea or vomiting. 04/09/24   Horton, Kristie M, DO    Allergies: Patient has no known allergies.    Review of Systems  Gastrointestinal:  Positive for diarrhea and vomiting.    Updated Vital Signs BP 119/62   Pulse 73   Temp 99.1 F (37.3 C) (Oral)   Resp 18   Ht 5' 9 (1.753 m)   Wt 79 kg   LMP 03/26/2024   SpO2 95%   BMI 25.72 kg/m   Physical Exam Vitals and nursing note reviewed.  Pulmonary:     Breath sounds: No wheezing.  Abdominal:     Tenderness: There is no abdominal tenderness.  Musculoskeletal:        General: No tenderness.     Cervical back: Neck supple.  Skin:    General: Skin is warm.     Capillary Refill: Capillary refill takes less than 2 seconds.  Neurological:     Mental Status: She is alert.     (all labs ordered are listed, but only abnormal results are displayed) Labs Reviewed  CBC WITH DIFFERENTIAL/PLATELET - Abnormal; Notable for the following components:      Result Value   Neutro Abs 8.6 (*)  Lymphs Abs 0.4 (*)    All other components within normal limits  COMPREHENSIVE METABOLIC PANEL WITH GFR - Abnormal; Notable for the following components:   Potassium 2.9 (*)    CO2 21 (*)    Glucose, Bld 175 (*)    Calcium 8.7 (*)    Alkaline Phosphatase 36 (*)    All other components within normal limits  URINALYSIS, ROUTINE W REFLEX MICROSCOPIC    EKG: None  Radiology: No results found.   Procedures   Medications Ordered in the ED  potassium chloride  10 mEq in 100 mL IVPB (10 mEq Intravenous New Bag/Given 04/11/24 1842)  prochlorperazine  (COMPAZINE ) injection 5 mg (5 mg Intravenous Given 04/11/24 1752)  magnesium  sulfate IVPB 2 g 50 mL (0 g Intravenous Stopped 04/11/24 1828)  lactated ringers  bolus 1,000 mL (1,000 mLs Intravenous New Bag/Given 04/11/24  1743)  lactated ringers  bolus 1,000 mL (1,000 mLs Intravenous New Bag/Given 04/11/24 1755)                                    Medical Decision Making Amount and/or Complexity of Data Reviewed Labs: ordered.  Risk Prescription drug management.   Patient with nausea vomiting diarrhea.  Some abdominal pain and cramping.  Recent negative CT scan.  No known sick contacts.  Reviewed previous ER notes.  Reviewed CT scan.  Now has a hypokalemia.  Magnesium  was low 2 days ago.  Likely lower today.  Will supplement both magnesium  and potassium.  Will give fluid bolus and some antiemetics.  Benign abdominal exam.  Feeling better after fluid bolus and antiemetics.  Has tolerated some crackers here.  Has been given potassium and magnesium .  Already has antiemetics at home.  Will supplement some potassium once vomiting improved.  Patient feels able to go home and tolerate some orals.  Will discharge.     Final diagnoses:  Nausea vomiting and diarrhea    ED Discharge Orders          Ordered    potassium chloride  SA (KLOR-CON  M) 20 MEQ tablet  2 times daily        04/11/24 1943               Patsey Lot, MD 04/11/24 1945

## 2024-04-11 NOTE — ED Triage Notes (Signed)
 Pt has had N/V/D x5 days. Pt seen yesterday for the same symptoms. Pt was sent home with instructions to follow up with primary provider. Pt states that she has had little to no relief.

## 2024-05-06 ENCOUNTER — Encounter (HOSPITAL_COMMUNITY): Payer: Self-pay

## 2024-05-06 ENCOUNTER — Emergency Department (HOSPITAL_COMMUNITY)
Admission: EM | Admit: 2024-05-06 | Discharge: 2024-05-06 | Disposition: A | Discharge status: Home / Self Care | Payer: MEDICAID | Attending: Emergency Medicine | Admitting: Emergency Medicine

## 2024-05-06 ENCOUNTER — Other Ambulatory Visit: Payer: Self-pay

## 2024-05-06 DIAGNOSIS — Z3A01 Less than 8 weeks gestation of pregnancy: Secondary | ICD-10-CM | POA: Insufficient documentation

## 2024-05-06 DIAGNOSIS — R011 Cardiac murmur, unspecified: Secondary | ICD-10-CM | POA: Insufficient documentation

## 2024-05-06 DIAGNOSIS — E109 Type 1 diabetes mellitus without complications: Secondary | ICD-10-CM | POA: Insufficient documentation

## 2024-05-06 DIAGNOSIS — O99411 Diseases of the circulatory system complicating pregnancy, first trimester: Secondary | ICD-10-CM | POA: Insufficient documentation

## 2024-05-06 DIAGNOSIS — O219 Vomiting of pregnancy, unspecified: Secondary | ICD-10-CM | POA: Diagnosis present

## 2024-05-06 DIAGNOSIS — Z5329 Procedure and treatment not carried out because of patient's decision for other reasons: Secondary | ICD-10-CM | POA: Insufficient documentation

## 2024-05-06 DIAGNOSIS — R112 Nausea with vomiting, unspecified: Secondary | ICD-10-CM

## 2024-05-06 DIAGNOSIS — Z7982 Long term (current) use of aspirin: Secondary | ICD-10-CM | POA: Diagnosis not present

## 2024-05-06 LAB — URINALYSIS, ROUTINE W REFLEX MICROSCOPIC
Bilirubin Urine: NEGATIVE
Glucose, UA: >=500 mg/dL — AB
Hgb urine dipstick: NEGATIVE
Ketones, ur: 80 mg/dL — AB
Leukocytes,Ua: NEGATIVE
Nitrite: NEGATIVE
Protein, ur: NEGATIVE mg/dL
Specific Gravity, Urine: 1.023 (ref 1.005–1.030)
pH: 7 (ref 5.0–8.0)

## 2024-05-06 LAB — COMPREHENSIVE METABOLIC PANEL WITH GFR
ALT: 30 U/L (ref 0–44)
AST: 29 U/L (ref 15–41)
Albumin: 4.2 g/dL (ref 3.5–5.0)
Alkaline Phosphatase: 38 U/L (ref 38–126)
Anion gap: 10 (ref 5–15)
BUN: 7 mg/dL (ref 6–20)
CO2: 23 mmol/L (ref 22–32)
Calcium: 9.1 mg/dL (ref 8.9–10.3)
Chloride: 104 mmol/L (ref 98–111)
Creatinine, Ser: 0.79 mg/dL (ref 0.44–1.00)
GFR, Estimated: >60 mL/min (ref >60)
Glucose, Bld: 233 mg/dL — ABNORMAL HIGH (ref 70–99)
Potassium: 3.6 mmol/L (ref 3.5–5.1)
Sodium: 137 mmol/L (ref 135–145)
Total Bilirubin: 0.9 mg/dL (ref 0.0–1.2)
Total Protein: 6.8 g/dL (ref 6.5–8.1)

## 2024-05-06 LAB — LIPASE, BLOOD: Lipase: 25 U/L (ref 11–51)

## 2024-05-06 LAB — TROPONIN I (HIGH SENSITIVITY): Troponin I (High Sensitivity): <2 ng/L (ref <18)

## 2024-05-06 LAB — CBC
HCT: 38.6 % (ref 36.0–46.0)
Hemoglobin: 13 g/dL (ref 12.0–15.0)
MCH: 30.2 pg (ref 26.0–34.0)
MCHC: 33.7 g/dL (ref 30.0–36.0)
MCV: 89.8 fL (ref 80.0–100.0)
Platelets: 241 K/uL (ref 150–400)
RBC: 4.3 MIL/uL (ref 3.87–5.11)
RDW: 12.7 % (ref 11.5–15.5)
WBC: 12 K/uL — ABNORMAL HIGH (ref 4.0–10.5)
nRBC: 0 % (ref 0.0–0.2)

## 2024-05-06 LAB — HCG, SERUM, QUALITATIVE: Preg, Serum: POSITIVE — AB

## 2024-05-06 LAB — HCG, QUANTITATIVE, PREGNANCY: hCG, Beta Chain, Quant, S: 20604 m[IU]/mL — ABNORMAL HIGH (ref <5)

## 2024-05-06 MED ORDER — METOCLOPRAMIDE HCL 5 MG/ML IJ SOLN
10.0000 mg | Freq: Once | INTRAMUSCULAR | Status: AC
  Administered 2024-05-06: 10 mg via INTRAVENOUS
  Filled 2024-05-06: qty 2

## 2024-05-06 MED ORDER — VITAMIN B-6 25 MG PO TABS
25.0000 mg | ORAL_TABLET | Freq: Once | ORAL | Status: AC
  Administered 2024-05-06: 25 mg via ORAL
  Filled 2024-05-06: qty 1

## 2024-05-06 MED ORDER — DOXYLAMINE SUCCINATE (SLEEP) 25 MG PO TABS
25.0000 mg | ORAL_TABLET | Freq: Once | ORAL | Status: AC
  Administered 2024-05-06: 25 mg via ORAL
  Filled 2024-05-06: qty 1

## 2024-05-06 MED ORDER — ONDANSETRON 4 MG PO TBDP: 4.0000 mg | ORAL_TABLET | Freq: Three times a day (TID) | ORAL | 0 refills | Status: AC | PRN

## 2024-05-06 MED ORDER — ONDANSETRON HCL 4 MG/2ML IJ SOLN
4.0000 mg | Freq: Four times a day (QID) | INTRAMUSCULAR | Status: DC | PRN
  Administered 2024-05-06: 4 mg via INTRAVENOUS
  Filled 2024-05-06: qty 2

## 2024-05-06 MED ORDER — SODIUM CHLORIDE 0.9 % IV BOLUS
1000.0000 mL | Freq: Once | INTRAVENOUS | Status: AC
  Administered 2024-05-06: 1000 mL via INTRAVENOUS

## 2024-05-06 MED ORDER — METOCLOPRAMIDE HCL 10 MG PO TABS: 10.0000 mg | ORAL_TABLET | Freq: Four times a day (QID) | ORAL | 0 refills | Status: AC | PRN

## 2024-05-06 MED ORDER — SODIUM CHLORIDE 0.9 % IV BOLUS: 1000.0000 mL | Freq: Once | INTRAVENOUS | Status: DC

## 2024-05-06 NOTE — Discharge Instructions (Addendum)
 You are seen today for nausea vomiting.  I am prescribing you Reglan  as well as Zofran  to use as needed for  nausea.  Be sure to wait a couple of hours before switching between antinausea medications.  You can take the nausea and vomiting are likely secondary to either marijuana or pregnancy or both.  Please stop THC use as this will only make your symptoms worse.  With you leaving AGAINST MEDICAL ADVICE, we should continue to monitor symptoms and please have a low threshold before come back to the emergency department for further evaluation.  Please be sure to follow-up with your PCP for the heart murmur that was noticed today.  This was likely his pregnancy-induced however we will have you continue to be evaluated.

## 2024-05-06 NOTE — ED Triage Notes (Signed)
 [facility] reports pt coming from home. Pt states she is 4-[redacted] weeks pregnant going to doctor to tomorrow for conformation. Pt states the nausea and vomiting has been going on for several months. Pt states she has lost about 20lbs. Pt is a diabetic. N/V this time for the past three days.

## 2024-05-06 NOTE — ED Provider Notes (Signed)
 [place] EMERGENCY DEPARTMENT AT [facility] Provider Note   CSN: [id number] Arrival date & time: 05/06/24  1250     Patient presents with: Emesis   Rebecca Bryant is a 28 y.o. female.   Emesis  Patient is a 28 year old female to the ED today for concerns for nausea, vomiting, diarrhea, mild lower abdominal cramping that started today.  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  Noted to have a chronic history of nausea and vomiting and seen 4 times in the last month for similar symptoms.  Reports to be approximately 4 to [redacted] weeks pregnant, positive pregnancy test on 7/18.  Reports THC use today as well as this past week.  Denies fever, headache, vision changes, chest pain, shortness of breath, hematemesis, cough, hematochezia, melena, dysuria, vaginal discharge, vaginal bleeding, vaginal pain, lower leg swelling.    Prior to Admission medications   Medication Sig Start Date End Date Taking? Authorizing Provider  aspirin  EC 81 MG tablet Take 81 mg by mouth daily. Swallow whole.   Yes [provider]  BIOTIN PO Take 1 tablet by mouth daily.   Yes [provider]  busPIRone  (BUSPAR ) 10 MG tablet Take 10 mg by mouth 2 (two) times daily as needed (anxiety).   Yes [provider]  FLUoxetine (PROZAC) 10 MG capsule Take 20 mg by mouth daily.   Yes [provider]  insulin  lispro (HUMALOG) 100 UNIT/ML injection Inject 150 Units into the skin See admin instructions. Load insulin  pump with 150 units every 3 days.   Yes [provider]  omeprazole (PRILOSEC) 40 MG capsule Take 40 mg by mouth daily.   Yes [provider]  ondansetron  (ZOFRAN -ODT) 4 MG disintegrating tablet Take 1 tablet (4 mg total) by mouth every 8 (eight) hours as needed for nausea or vomiting. 05/06/24  Yes Beola Terrall RAMAN, PA-C  Prenatal Vit-Fe Fumarate-FA (PRENATAL PO) Take 1 tablet by mouth daily.   Yes [provider]   Continuous Blood Gluc Receiver (DEXCOM G6 RECEIVER) DEVI Use as directed for continuous glucose monitoring. 07/18/20   [provider]  Continuous Blood Gluc Receiver (DEXCOM G6 RECEIVER) DEVI USE AS DIRECTED FOR CONTINOUS GLUCOSE MONITORING 08/03/20   [provider]  dicyclomine  (BENTYL ) 20 MG tablet Take 1 tablet (20 mg total) by mouth 2 (two) times daily. Patient not taking: Reported on 05/06/2024 04/09/24   Horton, Roxie M, DO  metoCLOPramide  (REGLAN ) 10 MG tablet Take 1 tablet (10 mg total) by mouth every 6 (six) hours as needed for nausea or vomiting. 05/06/24   Vylet Maffia S, PA-C  potassium chloride  SA (KLOR-CON  M) 20 MEQ tablet Take 1 tablet (20 mEq total) by mouth 2 (two) times daily. Patient not taking: Reported on 05/06/2024 04/11/24   Patsey Lot, MD  promethazine  (PHENERGAN ) 25 MG suppository Place 1 suppository (25 mg total) rectally every 6 (six) hours as needed for nausea or vomiting. Patient not taking: Reported on 05/06/2024 04/09/24   Horton, Kristie M, DO    Allergies: Patient has no known allergies.    Review of Systems  Gastrointestinal:  Positive for nausea and vomiting.  All other systems reviewed and are negative.   Updated Vital Signs BP 114/71 (BP Location: Right Arm)   Pulse 94   Temp 98.2 F (36.8 C)   Resp 18   LMP 03/26/2024   SpO2 100%   Physical Exam Vitals and nursing note reviewed.  Constitutional:  General: She is not in acute distress.    Appearance: Normal appearance. She is not ill-appearing or diaphoretic.  HENT:     Head: Normocephalic and atraumatic.  Eyes:     General: No scleral icterus.       Right eye: No discharge.        Left eye: No discharge.     Extraocular Movements: Extraocular movements intact.     Conjunctiva/sclera: Conjunctivae normal.  Cardiovascular:     Rate and Rhythm: Normal rate and regular rhythm.     Pulses: Normal pulses.     Heart sounds: Murmur heard.     No friction rub. No  gallop.  Pulmonary:     Effort: Pulmonary effort is normal. No respiratory distress.     Breath sounds: No stridor. No wheezing, rhonchi or rales.  Chest:     Chest wall: No tenderness.  Abdominal:     General: Abdomen is flat. There is no distension.     Palpations: Abdomen is soft.     Tenderness: There is no abdominal tenderness. There is no right CVA tenderness, left CVA tenderness, guarding or rebound.  Musculoskeletal:        General: No swelling, deformity or signs of injury.     Cervical back: Normal range of motion. No rigidity.     Right lower leg: No edema.     Left lower leg: No edema.  Skin:    General: Skin is warm and dry.     Findings: No bruising, erythema or lesion.  Neurological:     General: No focal deficit present.     Mental Status: She is alert and oriented to person, place, and time. Mental status is at baseline.     Sensory: No sensory deficit.     Motor: No weakness.  Psychiatric:        Mood and Affect: Mood normal.     (all labs ordered are listed, but only abnormal results are displayed) Labs Reviewed  COMPREHENSIVE METABOLIC PANEL WITH GFR - Abnormal; Notable for the following components:      Result Value   Glucose, Bld 233 (*)    All other components within normal limits  CBC - Abnormal; Notable for the following components:   WBC 12.0 (*)    All other components within normal limits  URINALYSIS, ROUTINE W REFLEX MICROSCOPIC - Abnormal; Notable for the following components:   Glucose, UA >=500 (*)    Ketones, ur 80 (*)    Bacteria, UA RARE (*)    All other components within normal limits  HCG, SERUM, QUALITATIVE - Abnormal; Notable for the following components:   Preg, Serum POSITIVE (*)    All other components within normal limits  HCG, QUANTITATIVE, PREGNANCY - Abnormal; Notable for the following components:   hCG, Beta Chain, Quant, S 20,604 (*)    All other components within normal limits  LIPASE, BLOOD  TROPONIN I (HIGH  SENSITIVITY)    EKG: None  Radiology: No results found.  Procedures   Medications Ordered in the ED  sodium chloride  0.9 % bolus 1,000 mL (has no administration in time range)  ondansetron  (ZOFRAN ) injection 4 mg (4 mg Intravenous Given 05/06/24 1552)  sodium chloride  0.9 % bolus 1,000 mL (1,000 mLs Intravenous New Bag/Given 05/06/24 1345)  metoCLOPramide  (REGLAN ) injection 10 mg (10 mg Intravenous Given 05/06/24 1343)  doxylamine  (Sleep) (UNISOM ) tablet 25 mg (25 mg Oral Given 05/06/24 1457)  pyridOXINE  (VITAMIN B6) tablet 25 mg (25 mg  Oral Given 05/06/24 1457)                                  Medical Decision Making Amount and/or Complexity of Data Reviewed Labs: ordered.  Risk OTC drugs. Prescription drug management.   This patient is a 28 year old who presents to the ED for concern of nausea and vomiting.  This been ongoing, however acutely worse today.  Reports THC use as well as having a positive pregnancy as on 7/18.  No abdominal pain at this time.  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  Using Reglan  at home without relief.  On physical exam, patient is in no acute distress, afebrile, alert and orient x 4, speaking in full sentences, nontachypneic, nontachycardic.  No abdominal tenderness noted on palpation.  Heart murmur was noted, soft and has not been previously noted in past encounters.  Suspect likely secondary to pregnancy even though it is early.  No lower leg edema.   For PE, ACS however troponin was done due to heart murmur noted today.  And was unremarkable.  Labs were also unremarkable only showing a mildly elevated white count of 12.0 likely secondary to vomiting.  Provided fluids and patient was noted to have been feeling significantly better on reevaluation.  Provided second liter and she was feeling even more improved.  Provided Reglan  without relief of nausea and vomiting as well as doxylamine  and  vitamin B6.  Talked to pharmacy who recommend that even though she is below the 10-week mark that Zofran  can be used for refractory cases.  Provided Zofran  and she is still suffering from nausea and vomiting.  At this time, patient wished to leave AMA.  Attempted to commit the patient to stay due to her not being able to tolerate p.o.  However she wished to go.  Told her to follow-up with PCP for the heart murmur noted today as well as to have low threshold to return to the ED for further evaluation.  Differential diagnoses prior to evaluation: The emergent differential diagnosis includes, but is not limited to, ACS, PE, pancreatitis, diverticulitis, UTI, cannabis hyperemesis, hyperemesis gravidarum, gastroenteritis, DKA, bowel obstruction. This is not an exhaustive differential.   Past Medical History / Co-morbidities / Social History: Type 1 diabetes, factor V deficiency, DVT.  Status post appendectomy  Additional history: Chart reviewed. Pertinent results include:   Seen in the ED on 04/11/2024 for nausea and vomiting and diarrhea, noting some abdominal pain at that time.  Negative CT scan at that time.  Noted to have a negative hCG quantitative on 04/08/2024.  Lab Tests/Imaging studies: I personally interpreted labs/imaging and the pertinent results include:   CBC notes an elevated white count likely secondary dehydration hCG quantitative 20,604 UA shows ketones but without any signs of acidosis noted on CMP, low suspicion for DKA Lipase unremarkable Troponin unremarkable    Medications: I ordered medication including Zofran , Reglan , LR, NS, doxylamine , vitamin B6.  I have reviewed the patients home medicines and have made adjustments as needed.  Critical Interventions: None  Social Determinants of Health: Notably uses THC use at home regularly and while pregnant, noting that she is pregnant  Disposition: After consideration of the diagnostic results and the patients response to  treatment, I feel that the patient would benefit from discharge as above, patient leaving AMA.   The plan is: Follow-up with  PCP for heart murmur, follow-up with OB/GYN for pregnancy, Zofran  and Reglan  as needed for nausea, return to the ED.     Final diagnoses:  Nausea and vomiting, unspecified vomiting type  Heart murmur  Left against medical advice    ED Discharge Orders          Ordered    ondansetron  (ZOFRAN -ODT) 4 MG disintegrating tablet  Every 8 hours PRN        05/06/24 1606    metoCLOPramide  (REGLAN ) 10 MG tablet  Every 6 hours PRN        05/06/24 1606               Beola Terrall RAMAN, PA-C 05/06/24 1612    Tegeler, Lonni PARAS, MD 05/06/24 [phone]

## 2024-05-06 NOTE — ED Notes (Signed)
 Pt ambulatory to restroom

## 2024-05-06 NOTE — ED Notes (Signed)
 PT had an episode of emesis immediately after receiving PO meds, provider notified.

## 2024-05-24 ENCOUNTER — Other Ambulatory Visit (HOSPITAL_COMMUNITY)
Admission: RE | Admit: 2024-05-24 | Discharge: 2024-05-24 | Disposition: A | Discharge status: Home / Self Care | Payer: MEDICAID | Source: Ambulatory Visit | Attending: Obstetrics and Gynecology | Admitting: Obstetrics and Gynecology

## 2024-05-24 ENCOUNTER — Other Ambulatory Visit: Payer: Self-pay

## 2024-05-24 ENCOUNTER — Ambulatory Visit (INDEPENDENT_AMBULATORY_CARE_PROVIDER_SITE_OTHER): Payer: MEDICAID

## 2024-05-24 DIAGNOSIS — O0990 Supervision of high risk pregnancy, unspecified, unspecified trimester: Secondary | ICD-10-CM | POA: Diagnosis present

## 2024-05-24 DIAGNOSIS — O0991 Supervision of high risk pregnancy, unspecified, first trimester: Secondary | ICD-10-CM | POA: Diagnosis not present

## 2024-05-24 DIAGNOSIS — Z3A09 9 weeks gestation of pregnancy: Secondary | ICD-10-CM | POA: Diagnosis not present

## 2024-05-24 LAB — POCT URINE PREGNANCY: Preg Test, Ur: POSITIVE — AB

## 2024-05-24 NOTE — Progress Notes (Signed)
 New OB Intake  I explained I am completing New OB Intake today. We discussed EDD of 12/26/2024, by Last Menstrual Period. Pt is G3P1011. I reviewed her allergies, medications and Medical/Surgical/OB history.    Patient Active Problem List   Diagnosis Date Noted   Supervision of high risk pregnancy, antepartum 05/24/2024   UTI (urinary tract infection) 06/01/2023   Intractable nausea and vomiting 09/12/2020   Diabetes mellitus without complication (HCC)    Abdominal pain    Tobacco abuse    Intractable abdominal pain     Concerns addressed today  Patient informed that the ultrasound is considered a limited obstetric ultrasound and is not intended to be a complete ultrasound exam.  Patient also informed that the ultrasound is not being completed with the intent of assessing for fetal or placental anomalies or any pelvic abnormalities. Explained that the purpose of today's ultrasound is to assess for viability.  Patient acknowledges the purpose of the exam and the limitations of the study.     Delivery Plans Plans to deliver at [facility]. Discussed the nature of our practice with multiple providers including residents and students. Due to the size of the practice, the delivering provider may not be the same as those providing prenatal care.   MyChart/Babyscripts MyChart access verified. I explained pt will have some visits in office and some virtually. Babyscripts app discussed and ordered.   Blood Pressure Cuff Blood pressure cuff discussedDiscussed to be used for virtual visits and or if needed BP checks weekly.  Anatomy US  Explained first scheduled US  will be around 19 weeks.   Last Pap No results found for: DIAGPAP  First visit review I reviewed new OB appt with patient. Explained pt will be seen by Dr. Abigail at first visit. Discussed [place] genetic screening with patient and significant other. Routine prenatal labs ordered.    Erminio DELENA Rumps, [place] 05/24/2024  10:03 AM

## 2024-05-25 ENCOUNTER — Ambulatory Visit: Payer: Self-pay | Admitting: Obstetrics and Gynecology

## 2024-05-25 ENCOUNTER — Encounter: Payer: Self-pay | Admitting: Obstetrics and Gynecology

## 2024-05-25 DIAGNOSIS — A749 Chlamydial infection, unspecified: Secondary | ICD-10-CM | POA: Insufficient documentation

## 2024-05-25 DIAGNOSIS — B1920 Unspecified viral hepatitis C without hepatic coma: Secondary | ICD-10-CM

## 2024-05-25 LAB — CERVICOVAGINAL ANCILLARY ONLY
Chlamydia: POSITIVE — AB
Comment: NEGATIVE
Comment: NORMAL
Neisseria Gonorrhea: NEGATIVE

## 2024-05-26 ENCOUNTER — Other Ambulatory Visit: Payer: Self-pay

## 2024-05-26 DIAGNOSIS — A749 Chlamydial infection, unspecified: Secondary | ICD-10-CM

## 2024-05-26 DIAGNOSIS — B1920 Unspecified viral hepatitis C without hepatic coma: Secondary | ICD-10-CM | POA: Insufficient documentation

## 2024-05-26 LAB — CBC/D/PLT+RPR+RH+ABO+RUBIGG...
Antibody Screen: NEGATIVE
Basophils Absolute: 0 x10E3/uL (ref 0.0–0.2)
Basos: 0 %
EOS (ABSOLUTE): 0.1 x10E3/uL (ref 0.0–0.4)
Eos: 2 %
HCV Ab: REACTIVE — AB
HIV Screen 4th Generation wRfx: NONREACTIVE
Hematocrit: 40.8 % (ref 34.0–46.6)
Hemoglobin: 12.9 g/dL (ref 11.1–15.9)
Hepatitis B Surface Ag: NEGATIVE
Immature Grans (Abs): 0 x10E3/uL (ref 0.0–0.1)
Immature Granulocytes: 0 %
Lymphocytes Absolute: 1.6 x10E3/uL (ref 0.7–3.1)
Lymphs: 17 %
MCH: 30.6 pg (ref 26.6–33.0)
MCHC: 31.6 g/dL (ref 31.5–35.7)
MCV: 97 fL (ref 79–97)
Monocytes Absolute: 0.8 x10E3/uL (ref 0.1–0.9)
Monocytes: 8 %
Neutrophils Absolute: 6.8 x10E3/uL (ref 1.4–7.0)
Neutrophils: 73 %
Platelets: 281 x10E3/uL (ref 150–450)
RBC: 4.22 x10E6/uL (ref 3.77–5.28)
RDW: 13.1 % (ref 11.7–15.4)
RPR Ser Ql: NONREACTIVE
Rh Factor: POSITIVE
Rubella Antibodies, IGG: 4.17 {index} (ref >0.99)
WBC: 9.4 x10E3/uL (ref 3.4–10.8)

## 2024-05-26 LAB — HCV RT-PCR, QUANT (NON-GRAPH)
HCV log10: 5.534 {Log_IU}/mL
Hepatitis C Quantitation: 342000 [IU]/mL

## 2024-05-26 MED ORDER — AZITHROMYCIN 500 MG PO TABS: 500.0000 mg | ORAL_TABLET | Freq: Once | ORAL | 0 refills | Status: AC

## 2024-05-26 NOTE — Telephone Encounter (Signed)
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 There is no treatment during pregnancy. She will need a CMP at her new OB visit. She should be counseled to avoid  sharing toothbrushes/razors and cover any wounds to avoid exposing others to blood. Risk of sexual transmission is low, but would recommend condom use for now (esp w/ +CT). Thanks - KF ----- Message ----- From: Venus Erminio LABOR, RN Sent: 05/24/2024  10:04 AM EDT To: Kieth BROCKS Erik, MD

## 2024-05-26 NOTE — Telephone Encounter (Signed)
 Patient notified of lab results and recommendations from Dr. Erik.  Patient agreed with recommendations.  Rebecca Bryant

## 2024-05-29 LAB — CULTURE, OB URINE

## 2024-05-29 LAB — URINE CULTURE, OB REFLEX

## 2024-05-29 MED ORDER — AMOXICILLIN-POT CLAVULANATE 875-125 MG PO TABS: 1.0000 | ORAL_TABLET | Freq: Two times a day (BID) | ORAL | 0 refills | Status: AC

## 2024-05-29 NOTE — Addendum Note (Signed)
 Addended by: ERIK FELTS on: 05/29/2024 10:08 PM   Modules accepted: Orders

## 2024-05-31 ENCOUNTER — Other Ambulatory Visit: Payer: Self-pay

## 2024-05-31 DIAGNOSIS — R112 Nausea with vomiting, unspecified: Secondary | ICD-10-CM

## 2024-05-31 MED ORDER — PROMETHAZINE HCL 25 MG RE SUPP: 25.0000 mg | Freq: Four times a day (QID) | RECTAL | 0 refills | Status: DC | PRN

## 2024-05-31 NOTE — Telephone Encounter (Signed)
 Left VM with patient per DPR that +urine culture.  Rx sent to pharmacy by Dr. Erik.  Rebecca Bryant

## 2024-05-31 NOTE — Telephone Encounter (Signed)
-----   Message from Kieth JAYSON Carolin sent at 05/29/2024 10:08 PM EDT ----- Also has asymptomatic bacteruria - abx sent. Please call patient to notify. Thanks - KF ----- Message ----- From: Venus Erminio LABOR, RN Sent: 05/24/2024  10:04 AM EDT To: Kieth JAYSON Carolin, MD

## 2024-06-01 ENCOUNTER — Encounter: Payer: MEDICAID | Admitting: Certified Nurse Midwife

## 2024-06-03 ENCOUNTER — Other Ambulatory Visit: Payer: Self-pay

## 2024-06-03 DIAGNOSIS — O0990 Supervision of high risk pregnancy, unspecified, unspecified trimester: Secondary | ICD-10-CM

## 2024-06-08 ENCOUNTER — Other Ambulatory Visit: Payer: MEDICAID

## 2024-06-10 ENCOUNTER — Other Ambulatory Visit: Payer: Self-pay

## 2024-06-10 DIAGNOSIS — R112 Nausea with vomiting, unspecified: Secondary | ICD-10-CM

## 2024-06-10 MED ORDER — PROMETHAZINE HCL 25 MG RE SUPP: 25.0000 mg | Freq: Four times a day (QID) | RECTAL | 0 refills | Status: AC | PRN

## 2024-06-16 ENCOUNTER — Encounter: Payer: MEDICAID | Admitting: Obstetrics and Gynecology
